# Patient Record
Sex: Male | Born: 1935 | Race: White | Hispanic: No | Marital: Married | Smoking: Former smoker
Health system: Southern US, Community
[De-identification: ages and names within clinical notes are randomized; demographics above are authoritative.]

## PROBLEM LIST (undated history)

## (undated) DIAGNOSIS — F0390 Unspecified dementia without behavioral disturbance: Secondary | ICD-10-CM

## (undated) DIAGNOSIS — M1990 Unspecified osteoarthritis, unspecified site: Secondary | ICD-10-CM

## (undated) DIAGNOSIS — I10 Essential (primary) hypertension: Secondary | ICD-10-CM

## (undated) DIAGNOSIS — I639 Cerebral infarction, unspecified: Secondary | ICD-10-CM

## (undated) HISTORY — DX: Unspecified osteoarthritis, unspecified site: M19.90

## (undated) HISTORY — DX: Cerebral infarction, unspecified: I63.9

## (undated) HISTORY — DX: Essential (primary) hypertension: I10

---

## 2004-02-21 ENCOUNTER — Emergency Department: Payer: Self-pay | Admitting: Emergency Medicine

## 2004-04-30 ENCOUNTER — Other Ambulatory Visit: Payer: Self-pay

## 2004-04-30 ENCOUNTER — Ambulatory Visit: Payer: Self-pay | Admitting: Urology

## 2004-05-05 ENCOUNTER — Ambulatory Visit: Payer: Self-pay | Admitting: Urology

## 2007-08-05 ENCOUNTER — Ambulatory Visit: Payer: Self-pay | Admitting: Dermatology

## 2008-05-09 ENCOUNTER — Ambulatory Visit: Payer: Self-pay | Admitting: Unknown Physician Specialty

## 2008-10-10 ENCOUNTER — Ambulatory Visit: Payer: Self-pay | Admitting: Family Medicine

## 2008-11-26 ENCOUNTER — Ambulatory Visit: Payer: Self-pay | Admitting: Family Medicine

## 2009-03-26 ENCOUNTER — Ambulatory Visit: Payer: Self-pay | Admitting: Family Medicine

## 2009-08-29 ENCOUNTER — Emergency Department: Payer: Self-pay | Admitting: Emergency Medicine

## 2011-01-08 ENCOUNTER — Inpatient Hospital Stay: Payer: Self-pay | Admitting: *Deleted

## 2011-01-09 DIAGNOSIS — I6789 Other cerebrovascular disease: Secondary | ICD-10-CM

## 2011-03-03 ENCOUNTER — Encounter: Payer: Self-pay | Admitting: Family Medicine

## 2013-04-24 ENCOUNTER — Ambulatory Visit: Payer: Self-pay | Admitting: Family Medicine

## 2013-10-09 ENCOUNTER — Ambulatory Visit: Payer: Self-pay | Admitting: Podiatry

## 2014-09-30 ENCOUNTER — Other Ambulatory Visit: Payer: Self-pay | Admitting: Family Medicine

## 2014-11-24 ENCOUNTER — Other Ambulatory Visit: Payer: Self-pay | Admitting: Family Medicine

## 2014-12-12 ENCOUNTER — Ambulatory Visit: Payer: Self-pay | Admitting: Family Medicine

## 2015-04-15 ENCOUNTER — Ambulatory Visit: Payer: PPO | Admitting: Podiatry

## 2015-04-23 ENCOUNTER — Telehealth: Payer: Self-pay | Admitting: Family Medicine

## 2015-04-23 DIAGNOSIS — G309 Alzheimer's disease, unspecified: Principal | ICD-10-CM

## 2015-04-23 DIAGNOSIS — F0280 Dementia in other diseases classified elsewhere without behavioral disturbance: Secondary | ICD-10-CM

## 2015-04-23 NOTE — Telephone Encounter (Signed)
Hospice referral as a clinical decision. I can offer home health referral. We'll need to see patient before I can even begin to consider a hospice referral. Actually I will need to see patient before I can offer home health referral. Either is a face-to-face visit.

## 2015-04-23 NOTE — Telephone Encounter (Signed)
Pt's son is requesting a letter for Hospice to come in the home to help his mother with bathing and other things like that.  He states his father is not able to get around and it is hard for his mother to help him clean himself.  States he is afraid his mother will hurt herself helping him.  Please contact the patient once it is ready for pick up.

## 2015-04-24 NOTE — Telephone Encounter (Signed)
Patient's son Lorin Picket advised of face-to-face visit before referral can be made. Lorin Picket reports that patient is very demented and is very weak and falls often. Lorin Picket reports that he has a very hard time getting pt up and does not know how he will be able to get him in the office. Please advise.

## 2015-04-26 ENCOUNTER — Other Ambulatory Visit: Payer: Self-pay | Admitting: Emergency Medicine

## 2015-04-26 DIAGNOSIS — R296 Repeated falls: Secondary | ICD-10-CM

## 2015-04-26 DIAGNOSIS — F0280 Dementia in other diseases classified elsewhere without behavioral disturbance: Secondary | ICD-10-CM

## 2015-04-26 DIAGNOSIS — G309 Alzheimer's disease, unspecified: Principal | ICD-10-CM

## 2015-04-26 NOTE — Telephone Encounter (Signed)
I have talked with Dr. Sullivan Lone and sent in orders for home health and PT for this patient. (he did a house call last night) Pt son has a meeting with [facility] today to see how they like them because they have a male nurse available and would be best for the pt.   Pt sonLorin [phone]

## 2015-04-29 NOTE — Addendum Note (Signed)
Addended by: Julieanne Manson on: 04/29/2015 09:27 AM   Modules accepted: Orders

## 2015-04-29 NOTE — Telephone Encounter (Signed)
I made a house call to see the patient and his wife as the wife and son said he was unable to get into the office. He has moderate dementia and is now having frequent falls due to balance issues and his unwillingness to use a walker. His appetite is good and he is not losing weight. He has no complaints when I see him in the home. Exam reveals an elderly white male in no acute distress. He is alert to person and place and knows who I am. Otherwise he obviously has a lot of confusion. He is unwilling to cooperate with his wife regarding the use of the walker. Exam reveals an to be atraumatic normocephalic. CO are regular rate and rhythm lungs clear abdomen is soft. He moves all 4 extremities without any obvious discomfort. He has trouble getting out of a low sitting couch and is unsteady on his feet even when using a walker. Long-term outlook for this 82 year old gentleman is unknown but they're certainly nowhere can call in hospice and say that he has less than a year to live. One of these falls may cut short his life but at this time we will obtain home health consultation with home  physical therapy. His wife lives with him but she has great difficulty getting him to the car by herself. Hopefully home physical therapy will be able to lower the risk of falls but I think his dementia and unwillingness  to cooperate will get in the way.

## 2015-05-01 ENCOUNTER — Telehealth: Payer: Self-pay | Admitting: Family Medicine

## 2015-05-01 NOTE — Telephone Encounter (Signed)
Larry Davidson with [facility] called to advise pt will be seen tomorrow for home health.  CB#[phone]/MW

## 2015-05-01 NOTE — Telephone Encounter (Signed)
ok 

## 2015-05-01 NOTE — Telephone Encounter (Signed)
[id number]-aa 

## 2015-05-02 DIAGNOSIS — Z9181 History of falling: Secondary | ICD-10-CM | POA: Diagnosis not present

## 2015-05-02 DIAGNOSIS — R2689 Other abnormalities of gait and mobility: Secondary | ICD-10-CM | POA: Diagnosis not present

## 2015-05-02 DIAGNOSIS — M6281 Muscle weakness (generalized): Secondary | ICD-10-CM | POA: Diagnosis not present

## 2015-05-02 DIAGNOSIS — F0280 Dementia in other diseases classified elsewhere without behavioral disturbance: Secondary | ICD-10-CM | POA: Diagnosis not present

## 2015-05-02 DIAGNOSIS — G309 Alzheimer's disease, unspecified: Secondary | ICD-10-CM | POA: Diagnosis not present

## 2015-05-02 DIAGNOSIS — I10 Essential (primary) hypertension: Secondary | ICD-10-CM | POA: Diagnosis not present

## 2015-05-02 DIAGNOSIS — Z7901 Long term (current) use of anticoagulants: Secondary | ICD-10-CM | POA: Diagnosis not present

## 2015-05-03 ENCOUNTER — Telehealth: Payer: Self-pay | Admitting: Family Medicine

## 2015-05-03 NOTE — Telephone Encounter (Signed)
Spoke with Nash Dimmer, she needed additional diagnoses for pt. Referred to allscripts for this.

## 2015-05-03 NOTE — Telephone Encounter (Signed)
Larry Davidson (with [facility]) calling to see if there are any other orders in for this pt, Larry Davidson states all she can see in Epic is orders for Alzheimer's & Falls. Larry Davidson states that she would like a return call. CB# [phone]   Thanks CC

## 2015-05-06 ENCOUNTER — Ambulatory Visit (INDEPENDENT_AMBULATORY_CARE_PROVIDER_SITE_OTHER): Payer: PPO | Admitting: Podiatry

## 2015-05-06 ENCOUNTER — Encounter: Payer: Self-pay | Admitting: Podiatry

## 2015-05-06 VITALS — BP 131/88 | HR 86 | Resp 12

## 2015-05-06 DIAGNOSIS — M79676 Pain in unspecified toe(s): Secondary | ICD-10-CM | POA: Diagnosis not present

## 2015-05-06 DIAGNOSIS — B351 Tinea unguium: Secondary | ICD-10-CM

## 2015-05-06 NOTE — Progress Notes (Signed)
   Subjective:    Patient ID: Larry Davidson, male    DOB: 25-Aug-1923, 82 y.o.   MRN: [id number]  HPI: He presents today with his wife chief complaint of painful elongated toenails. His wife states that she can no longer cut his nails for him and they're starting to grow into his skin.    Review of Systems  Constitutional: Positive for activity change, appetite change and unexpected weight change.  Musculoskeletal: Positive for myalgias and back pain.  Neurological: Positive for weakness.  Hematological: Bruises/bleeds easily.       Objective:   Physical Exam: Vital signs are stable he is alert. He had does have some dementia so conversations are brief. Pulses are palpable bilateral. Flexible hammertoe deformities are noted. Neurologic sensorium is noted deep tendon reflexes are intact muscle strength is normal bilateral. Cutaneous evaluation straight supple well-hydrated cutis toenails are thick yellow dystrophic likely clinically mycotic sharply incurvated and painful on debridement.        Assessment & Plan:  Pain in limb secondary to onychomycosis.  Plan: Debridement of toenails 1 through 5 bilateral.

## 2015-05-10 DIAGNOSIS — F0280 Dementia in other diseases classified elsewhere without behavioral disturbance: Secondary | ICD-10-CM | POA: Diagnosis not present

## 2015-05-10 DIAGNOSIS — Z7901 Long term (current) use of anticoagulants: Secondary | ICD-10-CM | POA: Diagnosis not present

## 2015-05-10 DIAGNOSIS — R2689 Other abnormalities of gait and mobility: Secondary | ICD-10-CM | POA: Diagnosis not present

## 2015-05-10 DIAGNOSIS — I10 Essential (primary) hypertension: Secondary | ICD-10-CM | POA: Diagnosis not present

## 2015-05-10 DIAGNOSIS — M6281 Muscle weakness (generalized): Secondary | ICD-10-CM | POA: Diagnosis not present

## 2015-05-10 DIAGNOSIS — Z9181 History of falling: Secondary | ICD-10-CM | POA: Diagnosis not present

## 2015-05-10 DIAGNOSIS — G309 Alzheimer's disease, unspecified: Secondary | ICD-10-CM | POA: Diagnosis not present

## 2015-05-17 DIAGNOSIS — R2689 Other abnormalities of gait and mobility: Secondary | ICD-10-CM | POA: Diagnosis not present

## 2015-05-17 DIAGNOSIS — G309 Alzheimer's disease, unspecified: Secondary | ICD-10-CM | POA: Diagnosis not present

## 2015-05-17 DIAGNOSIS — F0280 Dementia in other diseases classified elsewhere without behavioral disturbance: Secondary | ICD-10-CM | POA: Diagnosis not present

## 2015-05-17 DIAGNOSIS — I10 Essential (primary) hypertension: Secondary | ICD-10-CM | POA: Diagnosis not present

## 2015-05-31 DIAGNOSIS — G309 Alzheimer's disease, unspecified: Secondary | ICD-10-CM | POA: Diagnosis not present

## 2015-05-31 DIAGNOSIS — R2689 Other abnormalities of gait and mobility: Secondary | ICD-10-CM | POA: Diagnosis not present

## 2015-05-31 DIAGNOSIS — Z9181 History of falling: Secondary | ICD-10-CM | POA: Diagnosis not present

## 2015-05-31 DIAGNOSIS — F0280 Dementia in other diseases classified elsewhere without behavioral disturbance: Secondary | ICD-10-CM | POA: Diagnosis not present

## 2015-05-31 DIAGNOSIS — Z7901 Long term (current) use of anticoagulants: Secondary | ICD-10-CM | POA: Diagnosis not present

## 2015-05-31 DIAGNOSIS — I10 Essential (primary) hypertension: Secondary | ICD-10-CM | POA: Diagnosis not present

## 2015-05-31 DIAGNOSIS — M6281 Muscle weakness (generalized): Secondary | ICD-10-CM | POA: Diagnosis not present

## 2015-07-23 ENCOUNTER — Other Ambulatory Visit: Payer: Self-pay | Admitting: Family Medicine

## 2015-07-30 ENCOUNTER — Other Ambulatory Visit: Payer: Self-pay | Admitting: Family Medicine

## 2015-08-05 ENCOUNTER — Encounter: Payer: Self-pay | Admitting: Podiatry

## 2015-08-05 ENCOUNTER — Ambulatory Visit (INDEPENDENT_AMBULATORY_CARE_PROVIDER_SITE_OTHER): Payer: PPO | Admitting: Podiatry

## 2015-08-05 DIAGNOSIS — B351 Tinea unguium: Secondary | ICD-10-CM | POA: Diagnosis not present

## 2015-08-05 DIAGNOSIS — M79676 Pain in unspecified toe(s): Secondary | ICD-10-CM | POA: Diagnosis not present

## 2015-08-07 NOTE — Progress Notes (Signed)
Presents today with a chief complaint of painful elongated toenails.  Objective: Vital signs are stable alert and oriented 3. Pulses are palpable. Neurologic sensorium is intact. Deep tendon reflexes are intact. Toenails are thick yellow dystrophic onychomycotic.  Assessment: Pain in limb secondary to onychomycosis 1 through 5 bilateral.  Plan: Debridement of toenails 1 through 5 bilateral.

## 2015-10-07 ENCOUNTER — Other Ambulatory Visit: Payer: Self-pay

## 2015-10-07 MED ORDER — ATENOLOL 25 MG PO TABS: 25.0000 mg | ORAL_TABLET | Freq: Every day | ORAL | Status: DC

## 2015-11-03 ENCOUNTER — Other Ambulatory Visit: Payer: Self-pay | Admitting: Family Medicine

## 2015-11-06 ENCOUNTER — Ambulatory Visit (INDEPENDENT_AMBULATORY_CARE_PROVIDER_SITE_OTHER): Payer: PPO | Admitting: Family Medicine

## 2015-11-06 DIAGNOSIS — F0390 Unspecified dementia without behavioral disturbance: Secondary | ICD-10-CM | POA: Diagnosis not present

## 2015-11-06 DIAGNOSIS — I10 Essential (primary) hypertension: Secondary | ICD-10-CM | POA: Diagnosis not present

## 2015-11-06 DIAGNOSIS — E785 Hyperlipidemia, unspecified: Secondary | ICD-10-CM | POA: Diagnosis not present

## 2015-11-06 MED ORDER — AMLODIPINE BESYLATE 5 MG PO TABS: 5.0000 mg | ORAL_TABLET | Freq: Every day | ORAL | 3 refills | Status: DC

## 2015-11-06 NOTE — Progress Notes (Signed)
Subjective:  HPI  Hypertension, follow-up:  BP Readings from Last 3 Encounters:  11/06/15 (!) 124/56  05/06/15 131/88    He was last seen for hypertension 1 years ago.  BP at that visit was 131/88. Management since that visit includes none. He reports good compliance with treatment. He is not having side effects.  He is not exercising. He is not adherent to low salt diet.   Outside blood pressures are not being checked. He is experiencing none.  Patient denies chest pain, chest pressure/discomfort, claudication, dyspnea, exertional chest pressure/discomfort, irregular heart beat, lower extremity edema, near-syncope, orthopnea, paroxysmal nocturnal dyspnea and syncope.   Cardiovascular risk factors include advanced age (older than 2055 for men, 6865 for women), dyslipidemia, hypertension and male gender.  Wt Readings from Last 3 Encounters:  11/06/15 134 lb (60.8 kg)    ------------------------------------------------------------------------   Lipid/Cholesterol, Follow-up:   Last seen for this1 years ago.  Management changes since that visit include none. . Last Lipid Panel: No results found for: CHOL, TRIG, HDL, CHOLHDL, VLDL, LDLCALC, LDLDIRECT  Risk factors for vascular disease include hypercholesterolemia and hypertension  He reports good compliance with treatment. He is not having side effects.  Current symptoms include none and have been unchanged. Weight trend: decreasing  Wt Readings from Last 3 Encounters:  11/06/15 134 lb (60.8 kg)    ------------------------------------------------------------------- Dementia- pt wife reports that he has been sleeping all day and not eating. He says he is not hungry.   Prior to Admission medications   Medication Sig Start Date End Date Taking? Authorizing Provider  amLODipine (NORVASC) 5 MG tablet TAKE 1 TABLET BY MOUTH AT BEDTIME 07/23/15  Yes Annisten Manchester Hulen ShoutsL  Jr., MD  atenolol (TENORMIN) 25 MG tablet Take 1 tablet  (25 mg total) by mouth daily. 10/07/15   Taegen Lennox Hulen ShoutsL  Jr., MD  clopidogrel (PLAVIX) 75 MG tablet 1 TABLET, ORAL, DAILY 11/26/14   Maple Hudsonichard L  Jr., MD  donepezil (ARICEPT) 10 MG tablet 1 TABLET TABLET, ORAL, DAILY 11/26/14   Maple Hudsonichard L  Jr., MD  mirtazapine (REMERON) 30 MG tablet TAKE 1 TABLET BY MOUTH AT BEDTIME 07/30/15   Maple Hudsonichard L  Jr., MD  pravastatin (PRAVACHOL) 40 MG tablet TAKE 1 TABLET BY MOUTH AT BEDTIME 07/23/15   Jasaiah Karwowski Hulen ShoutsL  Jr., MD    Patient Active Problem List   Diagnosis Date Noted  . Hypertension 11/06/2015  . Dementia 11/06/2015  . Hyperlipidemia 11/06/2015    No past medical history on file.  Social History   Social History  . Marital status: Married    Spouse name: N/A  . Number of children: N/A  . Years of education: N/A   Occupational History  . Not on file.   Social History Main Topics  . Smoking status: Never Smoker  . Smokeless tobacco: Not on file  . Alcohol use No  . Drug use: No  . Sexual activity: Not on file   Other Topics Concern  . Not on file   Social History Narrative  . No narrative on file    No Known Allergies  Review of Systems  Constitutional: Negative.   HENT: Negative.   Eyes: Negative.   Respiratory: Negative.   Cardiovascular: Negative.   Gastrointestinal: Negative.   Genitourinary: Negative.   Musculoskeletal: Negative.   Skin: Negative.   Endo/Heme/Allergies: Negative.   Psychiatric/Behavioral: Positive for memory loss.     There is no immunization history on file for this patient. Objective:  BP (!) 124/56 (  BP Location: Left Arm, Patient Position: Sitting, Cuff Size: Normal)   Pulse 64   Temp 97.5 F (36.4 C) (Oral)   Resp 16   Wt 134 lb (60.8 kg)   Physical Exam  Constitutional: He is oriented to person, place, and time and well-developed, well-nourished, and in no distress.  HENT:  Head: Normocephalic and atraumatic.  Eyes: Conjunctivae and EOM are normal. Pupils are equal,  round, and reactive to light.  Neck: Normal range of motion. Neck supple.  Cardiovascular: Normal rate, regular rhythm, normal heart sounds and intact distal pulses.   Pulmonary/Chest: Effort normal and breath sounds normal.  Abdominal: Soft.  Musculoskeletal: Normal range of motion.  Neurological: He is alert and oriented to person, place, and time. He has normal reflexes. Gait normal. GCS score is 15.  Skin: Skin is warm and dry.  Psychiatric: Mood, memory, affect and judgment normal.    No results found for: WBC, HGB, HCT, PLT, GLUCOSE, CHOL, TRIG, HDL, LDLDIRECT, LDLCALC, TSH, PSA, INR, GLUF, HGBA1C, MICROALBUR  CMP  No results found for: NA, K, CL, CO2, GLUCOSE, BUN, CREATININE, CALCIUM, PROT, ALBUMIN, AST, ALT, ALKPHOS, BILITOT, GFRNONAA, GFRAA  Assessment and Plan :  1. Essential hypertension Stop Atenolol due to weight loss. May stop Plavix and statin in the future.   2. Dementia, without behavioral disturbance/mpderate/progressive MMSE today 14/30  3. Hyperlipidemia Consider stopping statin in the foreseeable future   Patient was seen and examined by Dr. Julieanne Manson, and noted scribed by Dimas Chyle, CMA Julieanne Manson MD  [facility] 11/06/2015 3:04 PM

## 2015-11-06 NOTE — Patient Instructions (Signed)
Stop Atenolol

## 2015-11-07 DIAGNOSIS — E785 Hyperlipidemia, unspecified: Secondary | ICD-10-CM | POA: Diagnosis not present

## 2015-11-07 DIAGNOSIS — I10 Essential (primary) hypertension: Secondary | ICD-10-CM | POA: Diagnosis not present

## 2015-11-08 LAB — CBC WITH DIFFERENTIAL/PLATELET
BASOS: 1 %
Basophils Absolute: 0 10*3/uL (ref 0.0–0.2)
EOS (ABSOLUTE): 0.2 10*3/uL (ref 0.0–0.4)
EOS: 3 %
HEMATOCRIT: 42.3 % (ref 37.5–51.0)
HEMOGLOBIN: 14.3 g/dL (ref 12.6–17.7)
Immature Grans (Abs): 0 10*3/uL (ref 0.0–0.1)
Immature Granulocytes: 0 %
LYMPHS ABS: 2.5 10*3/uL (ref 0.7–3.1)
Lymphs: 36 %
MCH: 31.9 pg (ref 26.6–33.0)
MCHC: 33.8 g/dL (ref 31.5–35.7)
MCV: 94 fL (ref 79–97)
MONOCYTES: 8 %
Monocytes Absolute: 0.6 10*3/uL (ref 0.1–0.9)
NEUTROS ABS: 3.6 10*3/uL (ref 1.4–7.0)
NEUTROS PCT: 52 %
Platelets: 188 10*3/uL (ref 150–379)
RBC: 4.48 x10E6/uL (ref 4.14–5.80)
RDW: 14.5 % (ref 12.3–15.4)
WBC: 6.9 10*3/uL (ref 3.4–10.8)

## 2015-11-08 LAB — TSH: TSH: 0.694 u[IU]/mL (ref 0.450–4.500)

## 2015-11-08 LAB — COMPREHENSIVE METABOLIC PANEL
ALBUMIN: 3.9 g/dL (ref 3.5–4.7)
ALK PHOS: 66 IU/L (ref 39–117)
ALT: 10 IU/L (ref 0–44)
AST: 10 IU/L (ref 0–40)
Albumin/Globulin Ratio: 1.7 (ref 1.2–2.2)
BILIRUBIN TOTAL: 0.5 mg/dL (ref 0.0–1.2)
BUN / CREAT RATIO: 16 (ref 10–24)
BUN: 21 mg/dL (ref 8–27)
CO2: 24 mmol/L (ref 18–29)
CREATININE: 1.35 mg/dL — AB (ref 0.76–1.27)
Calcium: 10.6 mg/dL — ABNORMAL HIGH (ref 8.6–10.2)
Chloride: 105 mmol/L (ref 96–106)
GFR calc non Af Amer: 46 mL/min/{1.73_m2} — ABNORMAL LOW (ref >59)
GFR, EST AFRICAN AMERICAN: 53 mL/min/{1.73_m2} — AB (ref >59)
GLOBULIN, TOTAL: 2.3 g/dL (ref 1.5–4.5)
Glucose: 99 mg/dL (ref 65–99)
Potassium: 4.5 mmol/L (ref 3.5–5.2)
SODIUM: 146 mmol/L — AB (ref 134–144)
Total Protein: 6.2 g/dL (ref 6.0–8.5)

## 2015-11-08 LAB — LIPID PANEL WITH LDL/HDL RATIO
Cholesterol, Total: 131 mg/dL (ref 100–199)
HDL: 39 mg/dL — AB (ref >39)
LDL Calculated: 70 mg/dL (ref 0–99)
LDL/HDL RATIO: 1.8 ratio (ref 0.0–3.6)
Triglycerides: 109 mg/dL (ref 0–149)
VLDL Cholesterol Cal: 22 mg/dL (ref 5–40)

## 2015-11-09 ENCOUNTER — Other Ambulatory Visit: Payer: Self-pay | Admitting: Family Medicine

## 2015-11-11 ENCOUNTER — Ambulatory Visit: Payer: PPO | Admitting: Podiatry

## 2015-11-11 ENCOUNTER — Other Ambulatory Visit: Payer: Self-pay

## 2015-11-11 NOTE — Telephone Encounter (Signed)
Pt called needing refills on Pravastatin and Mirtazapine. Allene DillonEmily Drozdowski, CMA

## 2015-11-12 MED ORDER — PRAVASTATIN SODIUM 40 MG PO TABS: 40.0000 mg | ORAL_TABLET | Freq: Every day | ORAL | 1 refills | Status: DC

## 2015-11-12 MED ORDER — MIRTAZAPINE 30 MG PO TABS: 30.0000 mg | ORAL_TABLET | Freq: Every day | ORAL | 1 refills | Status: DC

## 2015-12-01 ENCOUNTER — Other Ambulatory Visit: Payer: Self-pay | Admitting: Family Medicine

## 2015-12-05 ENCOUNTER — Other Ambulatory Visit: Payer: Self-pay | Admitting: Family Medicine

## 2015-12-09 ENCOUNTER — Other Ambulatory Visit: Payer: Self-pay | Admitting: Family Medicine

## 2015-12-23 ENCOUNTER — Telehealth: Payer: Self-pay | Admitting: Family Medicine

## 2015-12-23 NOTE — Telephone Encounter (Signed)
Pt's wife called wanting to know if he is due for his pneumonia vaccine.  Please advise [phone]  Thank sTeri

## 2015-12-23 NOTE — Telephone Encounter (Signed)
[facility], we do not have any pneumonia vaccines on file. I know that the patient would be due, but which one does he have 1st? Please advise. Thanks!

## 2015-12-23 NOTE — Telephone Encounter (Signed)
He had Prevnar in 2015 per [facility] records so he needs Pneumovax now since there is no record of it-aa

## 2015-12-23 NOTE — Telephone Encounter (Signed)
Advised wife as below. She reports that patient will get this through the pharmacy.

## 2016-02-12 ENCOUNTER — Encounter: Payer: Self-pay | Admitting: Family Medicine

## 2016-02-12 ENCOUNTER — Ambulatory Visit (INDEPENDENT_AMBULATORY_CARE_PROVIDER_SITE_OTHER): Payer: PPO | Admitting: Family Medicine

## 2016-02-12 ENCOUNTER — Ambulatory Visit: Payer: PPO | Admitting: Family Medicine

## 2016-02-12 VITALS — BP 132/64 | HR 88 | Temp 97.4°F | Resp 16 | Wt 134.0 lb

## 2016-02-12 DIAGNOSIS — E784 Other hyperlipidemia: Secondary | ICD-10-CM

## 2016-02-12 DIAGNOSIS — F0280 Dementia in other diseases classified elsewhere without behavioral disturbance: Secondary | ICD-10-CM | POA: Diagnosis not present

## 2016-02-12 DIAGNOSIS — I10 Essential (primary) hypertension: Secondary | ICD-10-CM

## 2016-02-12 DIAGNOSIS — E7849 Other hyperlipidemia: Secondary | ICD-10-CM

## 2016-02-12 DIAGNOSIS — G309 Alzheimer's disease, unspecified: Secondary | ICD-10-CM | POA: Diagnosis not present

## 2016-02-12 NOTE — Progress Notes (Addendum)
Larry Davidson  MRN: [id number] DOB: [birth year]  Subjective:  HPI  Patient is here for 3 months follow up.  Patient weighs the same 134lb today. Last time Atenolol was stopped due to weight loss. Patient states he eats fine but wife states patient will stay in bed at night and all day and she has to tell him to get up and eat. At the most twice a day. Husband states that he just feels lazy and does not want to get up. He is not checking his b/p.  MMSE on last visit in August was 14/30. He is sleeping all night. He still lives at home with his wife and he is not physically or verbally abusive even when he is confused.  Wt Readings from Last 3 Encounters:  02/12/16 134 lb (60.8 kg)  11/06/15 134 lb (60.8 kg)   Patient Active Problem List   Diagnosis Date Noted  . Hypertension 11/06/2015  . Dementia 11/06/2015  . Hyperlipidemia 11/06/2015    No past medical history on file.  Social History   Social History  . Marital status: Married    Spouse name: N/A  . Number of children: N/A  . Years of education: N/A   Occupational History  . Not on file.   Social History Main Topics  . Smoking status: Never Smoker  . Smokeless tobacco: Not on file  . Alcohol use No  . Drug use: No  . Sexual activity: Not on file   Other Topics Concern  . Not on file   Social History Narrative  . No narrative on file    Outpatient Encounter Prescriptions as of 02/12/2016  Medication Sig Note  . amLODipine (NORVASC) 5 MG tablet Take 1 tablet (5 mg total) by mouth at bedtime.   . clopidogrel (PLAVIX) 75 MG tablet TAKE 1 TABLET DAILY   . donepezil (ARICEPT) 10 MG tablet TAKE 1 TABLET DAILY   . mirtazapine (REMERON) 30 MG tablet Take 1 tablet (30 mg total) by mouth at bedtime.   . pravastatin (PRAVACHOL) 40 MG tablet Take 1 tablet (40 mg total) by mouth at bedtime.   . [DISCONTINUED] FLUZONE HIGH-DOSE 0.5 ML SUSY  02/12/2016: Received from: External Pharmacy  . [DISCONTINUED] PNEUMOVAX  23 25 MCG/0.5ML injection  02/12/2016: Received from: External Pharmacy   No facility-administered encounter medications on file as of 02/12/2016.     No Known Allergies  Review of Systems  Constitutional: Negative.        Not eating a lot. "feels lazy"  Respiratory: Negative.   Cardiovascular: Negative.   Musculoskeletal: Negative.   Neurological: Negative for dizziness and tingling.  Psychiatric/Behavioral: Positive for memory loss.    Objective:  BP 132/64   Pulse 88   Temp 97.4 F (36.3 C)   Resp 16   Wt 134 lb (60.8 kg)   Physical Exam  Constitutional: He is well-developed, well-nourished, and in no distress.  Cardiovascular: Normal rate, regular rhythm, normal heart sounds and intact distal pulses.   Pulmonary/Chest: Effort normal and breath sounds normal. No respiratory distress. He has no wheezes.    Assessment and Plan :  1. Essential hypertension Stable.  2. Other hyperlipidemia Discussed possible side effects with this medication. Advised wife to finish Pravastatin and then stop. I think benefits minimal at this time. 3. Alzheimer's dementia without behavioral disturbance, unspecified timing of dementia onset Weight  Is stable.Will follow and pt will worsen or  More than 50% of visit is spent in counselling .  4. Hypercalcemia Re check levels today, I do not think we need to get further work up for this at his age.May discuss with endocrine if this is higher or if w/u is positive in any way. - PTH, Intact and Calcium - Calcium, ionized  HPI, Exam and A&P transcribed under direction and in the presence of Julieanne Mansonichard , MD. I have done the exam and reviewed the chart and it is accurate to the best of my knowledge. [employer]agon  technology has been used and  any errors in dictation or transcription are unintentional. Julieanne Mansonichard  M.D. [facility]  [facility]will [facility] calcium in  December--if above 11 consider referring to Endocrine or treating with Cinacalcet 30mg  BID.

## 2016-02-13 LAB — CALCIUM, IONIZED: CALCIUM ION: 5.9 mg/dL — AB (ref 4.5–5.6)

## 2016-02-13 LAB — PTH, INTACT AND CALCIUM
Calcium: 10.9 mg/dL — ABNORMAL HIGH (ref 8.6–10.2)
PTH: 77 pg/mL — ABNORMAL HIGH (ref 15–65)

## 2016-03-03 ENCOUNTER — Ambulatory Visit: Payer: Self-pay | Admitting: Family Medicine

## 2016-03-03 ENCOUNTER — Ambulatory Visit (INDEPENDENT_AMBULATORY_CARE_PROVIDER_SITE_OTHER): Payer: PPO | Admitting: Family Medicine

## 2016-03-03 ENCOUNTER — Telehealth: Payer: Self-pay | Admitting: Family Medicine

## 2016-03-03 ENCOUNTER — Encounter: Payer: Self-pay | Admitting: Family Medicine

## 2016-03-03 VITALS — BP 116/58 | HR 105 | Temp 98.1°F | Resp 16

## 2016-03-03 DIAGNOSIS — I10 Essential (primary) hypertension: Secondary | ICD-10-CM

## 2016-03-03 NOTE — Progress Notes (Signed)
Patient: Larry Davidson Male    DOB: 1923/10/07   82 y.o.   MRN: [id number] Visit Date: 03/03/2016  Today's Provider: Megan Mansichard Gilbert Jr, MD   Chief Complaint  Patient presents with  . Weakness   Subjective:    HPI   Hypercalcemia Pt had labs checked on 02/12/2016, and was found to have high calcium (10.9, PTH was 77). Pt's wife is concerned because pt has been experiencing generalized weakness, which has been getting worse this week. Pt also experiencing recurrent falls (2 times this week), appetite loss, possible weight loss, fatigue, some disorientation. Wife had trouble getting pt to appt today due to pt wishing to sleep and unsteady gait. [place] helps wife bring pt in today.  Denies dizziness, presyncope, syncope, headache.  No Known Allergies   Current Outpatient Prescriptions:  .  amLODipine (NORVASC) 5 MG tablet, Take 1 tablet (5 mg total) by mouth at bedtime., Disp: 90 tablet, Rfl: 3 .  clopidogrel (PLAVIX) 75 MG tablet, TAKE 1 TABLET DAILY, Disp: 90 tablet, Rfl: 3 .  donepezil (ARICEPT) 10 MG tablet, TAKE 1 TABLET DAILY, Disp: 90 tablet, Rfl: 3 .  mirtazapine (REMERON) 30 MG tablet, Take 1 tablet (30 mg total) by mouth at bedtime., Disp: 90 tablet, Rfl: 1 .  pravastatin (PRAVACHOL) 40 MG tablet, Take 40 mg by mouth daily. , Disp: , Rfl:   Review of Systems  Constitutional: Positive for activity change, appetite change, fatigue and unexpected weight change (losing per pt; did not weigh today due to weakness and being in wheelchair). Negative for chills, diaphoresis and fever.  HENT: Negative.   Respiratory: Negative for shortness of breath.   Cardiovascular: Negative for chest pain, palpitations and leg swelling.  Endocrine: Positive for cold intolerance.  Allergic/Immunologic: Negative.   Neurological: Positive for weakness. Negative for dizziness, syncope and headaches.  Psychiatric/Behavioral: Negative.     Social History  Substance Use Topics  .  Smoking status: Never Smoker  . Smokeless tobacco: Not on file  . Alcohol use No   Objective:   BP (!) 116/58 (BP Location: Right Arm, Patient Position: Sitting, Cuff Size: Normal)   Pulse (!) 105   Temp 98.1 F (36.7 C) (Oral)   Resp 16   SpO2 97%   Physical Exam  Constitutional: He is oriented to person, place, and time. He appears well-developed and well-nourished.  HENT:  Head: Normocephalic and atraumatic.  Right Ear: External ear normal.  Left Ear: External ear normal.  Nose: Nose normal.  Eyes: Conjunctivae are normal. No scleral icterus.  Neck: No thyromegaly present.  Cardiovascular: Normal rate, regular rhythm and normal heart sounds.   Pulmonary/Chest: Effort normal and breath sounds normal.  Abdominal: Soft.  Neurological: He is alert and oriented to person, place, and time. No cranial nerve deficit. He exhibits normal muscle tone. Coordination normal.  Skin: Skin is warm and dry.  Psychiatric: He has a normal mood and affect. His behavior is normal. Judgment and thought content normal.        Assessment & Plan:     1. Essential hypertension  - Comprehensive metabolic panel - TSH  2. Hypercalcemia Difficulty here is advanced age and progressive moderate Alzheimers Disease.Refer to Endocrinology for input on moving forward. Hypercalcemia could be making cognitive issues worse. - CBC with Differential/Platelet - CK - Ambulatory referral to Endocrinology 3.Alzheimers Dementia--moderate Pt on Aricept--consider 2nd drug. Assisted living may become necessary--this was discussed with wife briefly.  4.Fatigue Multifactorial--pt sleeping 10-18  hours per day c/w dementia progression.  5.Insomnia Treated with Mirtazapine which helped initially. 6.Fall Risk Major issue going forward.  I have done the exam and reviewed the chart and it is accurate to the best of my knowledge. DentistDragon  technology has been used and  any errors in dictation or transcription are  unintentional. Julieanne Mansonichard Gilbert M.D. [facility]      Patient seen and examined by Julieanne Mansonichard Gilbert, MD, and note scribed by Allene DillonEmily Drozdowski, CMA. I have done the exam and reviewed the above chart and it is accurate to the best of my knowledge. DentistDragon  technology has been used in this note in any air is in the dictation or transcription are unintentional.  Megan Mansichard Gilbert Jr, MD  [facility]

## 2016-03-03 NOTE — Telephone Encounter (Signed)
spoke with wife. Pt was up and moving fine this morning, then wast sitting on the couch and and is disoriented and weak. When he tries to get up he stumbles around. She report that he breathing different. She was advised to call EMS and go to the ER, but she said she was going to call her son and figure out what she needed to so. Spoke with Dr. Sullivan LoneGilbert through out the conversation with her and he agreed she needed to call EMS for him and told me he would even call over there to let them know she was coming. She still said she would call her son and decide.

## 2016-03-03 NOTE — Telephone Encounter (Signed)
Pt's wife called stated that pt was too weak to make the appt this afternoon and wanted to get a home health nurse to the home instead b/c she thinks it might be pt's calcium level that is causing pt to be so fatigued and not eating this week. I tried to explain to pt we needed an OV for home health. She requested to keep the appt for this afternoon for now and requested a nurse call her back. Thanks TNP

## 2016-03-04 LAB — COMPREHENSIVE METABOLIC PANEL
A/G RATIO: 1.8 (ref 1.2–2.2)
ALBUMIN: 4.1 g/dL (ref 3.2–4.6)
ALK PHOS: 53 IU/L (ref 39–117)
ALT: 16 IU/L (ref 0–44)
AST: 14 IU/L (ref 0–40)
BILIRUBIN TOTAL: 0.3 mg/dL (ref 0.0–1.2)
BUN / CREAT RATIO: 32 — AB (ref 10–24)
BUN: 43 mg/dL — ABNORMAL HIGH (ref 10–36)
CHLORIDE: 106 mmol/L (ref 96–106)
CO2: 26 mmol/L (ref 18–29)
Calcium: 10.9 mg/dL — ABNORMAL HIGH (ref 8.6–10.2)
Creatinine, Ser: 1.35 mg/dL — ABNORMAL HIGH (ref 0.76–1.27)
GFR calc Af Amer: 53 mL/min/{1.73_m2} — ABNORMAL LOW (ref >59)
GFR calc non Af Amer: 46 mL/min/{1.73_m2} — ABNORMAL LOW (ref >59)
GLOBULIN, TOTAL: 2.3 g/dL (ref 1.5–4.5)
GLUCOSE: 147 mg/dL — AB (ref 65–99)
POTASSIUM: 4.1 mmol/L (ref 3.5–5.2)
SODIUM: 147 mmol/L — AB (ref 134–144)
Total Protein: 6.4 g/dL (ref 6.0–8.5)

## 2016-03-04 LAB — CBC WITH DIFFERENTIAL/PLATELET
BASOS ABS: 0 10*3/uL (ref 0.0–0.2)
Basos: 0 %
EOS (ABSOLUTE): 0 10*3/uL (ref 0.0–0.4)
Eos: 0 %
HEMATOCRIT: 30.9 % — AB (ref 37.5–51.0)
HEMOGLOBIN: 10.4 g/dL — AB (ref 13.0–17.7)
Immature Grans (Abs): 0 10*3/uL (ref 0.0–0.1)
Immature Granulocytes: 0 %
LYMPHS ABS: 1.4 10*3/uL (ref 0.7–3.1)
Lymphs: 15 %
MCH: 31.9 pg (ref 26.6–33.0)
MCHC: 33.7 g/dL (ref 31.5–35.7)
MCV: 95 fL (ref 79–97)
MONOCYTES: 6 %
Monocytes Absolute: 0.5 10*3/uL (ref 0.1–0.9)
NEUTROS ABS: 7.6 10*3/uL — AB (ref 1.4–7.0)
Neutrophils: 79 %
Platelets: 287 10*3/uL (ref 150–379)
RBC: 3.26 x10E6/uL — ABNORMAL LOW (ref 4.14–5.80)
RDW: 14.4 % (ref 12.3–15.4)
WBC: 9.6 10*3/uL (ref 3.4–10.8)

## 2016-03-04 LAB — CK: Total CK: 34 U/L (ref 24–204)

## 2016-03-04 LAB — TSH: TSH: 0.444 u[IU]/mL — AB (ref 0.450–4.500)

## 2016-03-09 ENCOUNTER — Telehealth: Payer: Self-pay | Admitting: Family Medicine

## 2016-03-09 NOTE — Telephone Encounter (Signed)
OK for now to see endocrinology.

## 2016-03-09 NOTE — Telephone Encounter (Signed)
Just spoke with Rosey Batheresa, wife called again and states they have not heard from endocrinologist yet, looks like information was faxed to their office on Friday. She wants to know does patient really need to come tomorrow he is doing much better and if Dr Sullivan LoneGilbert does not know what to do for patient and that is why he was referred to endocrinology then he does not need to come in for nothing in her opinion. Please review-aa

## 2016-03-09 NOTE — Telephone Encounter (Signed)
Patient's wife already cancelled appointment earlier at 12:25 pm-aa

## 2016-03-09 NOTE — Telephone Encounter (Signed)
Pt wife is asking if a nurse will call back to discuss pt health.  CB#[phone]/MW

## 2016-03-10 ENCOUNTER — Ambulatory Visit: Payer: PPO | Admitting: Family Medicine

## 2016-03-13 DIAGNOSIS — E210 Primary hyperparathyroidism: Secondary | ICD-10-CM | POA: Diagnosis not present

## 2016-03-17 DIAGNOSIS — E210 Primary hyperparathyroidism: Secondary | ICD-10-CM | POA: Diagnosis not present

## 2016-05-07 ENCOUNTER — Other Ambulatory Visit: Payer: Self-pay | Admitting: Family Medicine

## 2016-05-14 ENCOUNTER — Ambulatory Visit: Payer: PPO | Admitting: Family Medicine

## 2016-06-10 DIAGNOSIS — G4710 Hypersomnia, unspecified: Secondary | ICD-10-CM | POA: Diagnosis not present

## 2016-06-10 DIAGNOSIS — E559 Vitamin D deficiency, unspecified: Secondary | ICD-10-CM | POA: Diagnosis not present

## 2016-06-10 DIAGNOSIS — E210 Primary hyperparathyroidism: Secondary | ICD-10-CM | POA: Diagnosis not present

## 2016-06-28 DIAGNOSIS — T148XXA Other injury of unspecified body region, initial encounter: Secondary | ICD-10-CM | POA: Diagnosis not present

## 2016-06-28 DIAGNOSIS — S81012A Laceration without foreign body, left knee, initial encounter: Secondary | ICD-10-CM | POA: Diagnosis not present

## 2016-06-28 DIAGNOSIS — S50312A Abrasion of left elbow, initial encounter: Secondary | ICD-10-CM | POA: Diagnosis not present

## 2016-06-28 DIAGNOSIS — F0390 Unspecified dementia without behavioral disturbance: Secondary | ICD-10-CM | POA: Diagnosis not present

## 2016-06-28 DIAGNOSIS — S0083XA Contusion of other part of head, initial encounter: Secondary | ICD-10-CM | POA: Diagnosis not present

## 2016-06-28 DIAGNOSIS — M25512 Pain in left shoulder: Secondary | ICD-10-CM | POA: Diagnosis not present

## 2016-06-28 DIAGNOSIS — W1830XA Fall on same level, unspecified, initial encounter: Secondary | ICD-10-CM | POA: Diagnosis not present

## 2016-06-28 DIAGNOSIS — S40012A Contusion of left shoulder, initial encounter: Secondary | ICD-10-CM | POA: Diagnosis not present

## 2016-06-28 DIAGNOSIS — S4992XA Unspecified injury of left shoulder and upper arm, initial encounter: Secondary | ICD-10-CM | POA: Diagnosis not present

## 2016-06-28 DIAGNOSIS — S0990XA Unspecified injury of head, initial encounter: Secondary | ICD-10-CM | POA: Diagnosis not present

## 2016-06-28 DIAGNOSIS — S199XXA Unspecified injury of neck, initial encounter: Secondary | ICD-10-CM | POA: Diagnosis not present

## 2016-06-28 DIAGNOSIS — S80212A Abrasion, left knee, initial encounter: Secondary | ICD-10-CM | POA: Diagnosis not present

## 2016-06-28 DIAGNOSIS — Y92009 Unspecified place in unspecified non-institutional (private) residence as the place of occurrence of the external cause: Secondary | ICD-10-CM | POA: Diagnosis not present

## 2016-07-22 ENCOUNTER — Ambulatory Visit: Payer: PPO | Admitting: Family Medicine

## 2016-07-22 ENCOUNTER — Ambulatory Visit: Payer: PPO

## 2016-07-31 ENCOUNTER — Telehealth: Payer: Self-pay | Admitting: Family Medicine

## 2016-07-31 NOTE — Telephone Encounter (Signed)
Spoke to patients wife to r/s AWV. She states she will call when she can have her son bring Mr.Merilynn Finlandobertson. States he is doing well right now. Call back number provided. - Tiffany,LPN

## 2016-08-11 ENCOUNTER — Ambulatory Visit (INDEPENDENT_AMBULATORY_CARE_PROVIDER_SITE_OTHER): Payer: PPO

## 2016-08-11 ENCOUNTER — Ambulatory Visit (INDEPENDENT_AMBULATORY_CARE_PROVIDER_SITE_OTHER): Payer: PPO | Admitting: Family Medicine

## 2016-08-11 VITALS — BP 118/62 | HR 80 | Temp 98.6°F | Resp 18 | Ht 67.0 in | Wt 128.8 lb

## 2016-08-11 DIAGNOSIS — F0280 Dementia in other diseases classified elsewhere without behavioral disturbance: Secondary | ICD-10-CM

## 2016-08-11 DIAGNOSIS — Z0000 Encounter for general adult medical examination without abnormal findings: Secondary | ICD-10-CM | POA: Diagnosis not present

## 2016-08-11 DIAGNOSIS — I10 Essential (primary) hypertension: Secondary | ICD-10-CM | POA: Diagnosis not present

## 2016-08-11 DIAGNOSIS — G309 Alzheimer's disease, unspecified: Secondary | ICD-10-CM | POA: Diagnosis not present

## 2016-08-11 NOTE — Progress Notes (Signed)
Patient: Larry LegatoHarold E Alvi Male    DOB: 1923/11/18   83 y.o.   MRN: [id number] Visit Date: 08/11/2016  Today's Provider: Megan Mansichard  Jr, MD   Chief Complaint  Patient presents with  . Hypertension  . Hyperlipidemia  . Dementia   Subjective:    HPI  Hypertension, follow-up:  BP Readings from Last 3 Encounters:  08/11/16 118/62  03/03/16 (!) 116/58  02/12/16 132/64    He was last seen for hypertension 4 months ago.  BP at that visit was 116/58. Management since that visit includes no changes. He reports good compliance with treatment. He is not having side effects.  He is not exercising. He is adherent to low salt diet.   Outside blood pressures are checked daily. He is experiencing none.  Patient denies exertional chest pressure/discomfort and lower extremity edema.   Cardiovascular risk factors include dyslipidemia.     Weight trend: stable Wt Readings from Last 3 Encounters:  08/11/16 128 lb 12.8 oz (58.4 kg)  02/12/16 134 lb (60.8 kg)  11/06/15 134 lb (60.8 kg)    Current diet: well balanced     Lipid/Cholesterol, Follow-up:   Last seen for this4 months ago.  Management changes since that visit include no changes. . Last Lipid Panel:    Component Value Date/Time   CHOL 131 11/07/2015 0920   TRIG 109 11/07/2015 0920   HDL 39 (L) 11/07/2015 0920   LDLCALC 70 11/07/2015 0920    Risk factors for vascular disease include hypertension  He reports good compliance with treatment. He is not having side effects.  Current symptoms include none and have been stable. Weight trend: stable Prior visit with dietician: no Current diet: well balanced Current exercise: none  Wt Readings from Last 3 Encounters:  08/11/16 128 lb 12.8 oz (58.4 kg)  02/12/16 134 lb (60.8 kg)  11/06/15 134 lb (60.8 kg)     Dementia, follow up: Patient was last seen in the office 4 months ago. No changes were made in his medications. Patient is currently taking  Aricept, and he is tolerating the medication well.     MMSE - Mini Mental State Exam 08/11/2016  Orientation to time 0  Orientation to Place 2  Registration 0  Attention/ Calculation 0  Recall 0  Language- name 2 objects 2  Language- repeat 1  Language- follow 3 step command 3  Language- read & follow direction 1  Write a sentence 1  Copy design 0  Total score 10    No Known Allergies   Current Outpatient Prescriptions:  .  amLODipine (NORVASC) 5 MG tablet, Take 1 tablet (5 mg total) by mouth at bedtime., Disp: 90 tablet, Rfl: 3 .  Cholecalciferol (VITAMIN D-1000 MAX ST) 1000 units tablet, Take by mouth., Disp: , Rfl:  .  clopidogrel (PLAVIX) 75 MG tablet, TAKE 1 TABLET DAILY, Disp: 90 tablet, Rfl: 3 .  donepezil (ARICEPT) 10 MG tablet, TAKE 1 TABLET DAILY (Patient not taking: Reported on 08/11/2016), Disp: 90 tablet, Rfl: 3 .  mirtazapine (REMERON) 30 MG tablet, Take 1 tablet (30 mg total) by mouth at bedtime. (Patient not taking: Reported on 08/11/2016), Disp: 90 tablet, Rfl: 1  Review of Systems  Constitutional: Negative.   HENT: Negative.   Eyes: Negative.   Cardiovascular: Negative.   Endocrine: Negative.   Musculoskeletal: Negative.   Allergic/Immunologic: Negative.   Neurological: Negative.   Psychiatric/Behavioral: Negative.     Social History  Substance Use Topics  .  Smoking status: Former Smoker    Quit date: 03/30/1951  . Smokeless tobacco: Never Used  . Alcohol use No   Objective:     BP  118/62 (BP Location: Right Arm, Patient Position: Sitting)     Pulse  80     Temp  98.6 F (37 C)     Resp  18     Ht  5\' 7"  (1.702 m)      Wt  128 lb 12.8 oz (58.4 kg)   BMI  20.17 kg/m       Physical Exam  Constitutional: He is oriented to person, place, and time. He appears well-developed and well-nourished.  HENT:  Head: Normocephalic and atraumatic.  Right Ear: External ear normal.  Left Ear: External ear normal.  Nose: Nose normal.    Eyes: Conjunctivae are normal.  Cardiovascular: Normal rate and normal heart sounds.   Pulmonary/Chest: Effort normal and breath sounds normal.  Abdominal: Soft.  Neurological: He is alert and oriented to person, place, and time.  Skin: Skin is warm and dry.  Psychiatric: He has a normal mood and affect. His behavior is normal. Judgment and thought content normal.        Assessment & Plan:     1. Essential hypertension   2. Alzheimer's dementia without behavioral disturbance, unspecified timing of dementia onset Moderate/severe.Consider Hospice in future.More than 50% of 25 minute visit spent in counselling. 3.HLD Stop pravachol to simplify regimen.     I have done the exam and reviewed the above chart and it is accurate to the best of my knowledge. Dentist has been used in this note in any air is in the dictation or transcription are unintentional.  Megan Mans, MD  [facility]

## 2016-08-11 NOTE — Progress Notes (Signed)
Subjective:   Larry Davidson is a 83 y.o. male who presents for Medicare Annual/Subsequent preventive examination.  Review of Systems:   Cardiac Risk Factors include: hypertension;advanced age (>7555men, 56>65 women)     Objective:    Vitals: BP 118/62 (BP Location: Right Arm, Patient Position: Sitting)   Pulse 80   Temp 98.6 F (37 C)   Resp 18   Ht 5\' 7"  (1.702 m)   Wt 128 lb 12.8 oz (58.4 kg)   BMI 20.17 kg/m   Body mass index is 20.17 kg/m.  Tobacco History  Smoking Status  . Former Smoker  . Quit date: 03/30/1951  Smokeless Tobacco  . Never Used     Counseling given: Not Answered   History reviewed. No pertinent past medical history. History reviewed. No pertinent surgical history. History reviewed. No pertinent family history. History  Sexual Activity  . Sexual activity: Not on file    Outpatient Encounter Prescriptions as of 08/11/2016  Medication Sig  . amLODipine (NORVASC) 5 MG tablet Take 1 tablet (5 mg total) by mouth at bedtime.  . Cholecalciferol (VITAMIN D-1000 MAX ST) 1000 units tablet Take by mouth.  . clopidogrel (PLAVIX) 75 MG tablet TAKE 1 TABLET DAILY  . donepezil (ARICEPT) 10 MG tablet TAKE 1 TABLET DAILY (Patient not taking: Reported on 08/11/2016)  . mirtazapine (REMERON) 30 MG tablet Take 1 tablet (30 mg total) by mouth at bedtime. (Patient not taking: Reported on 08/11/2016)  . [DISCONTINUED] pravastatin (PRAVACHOL) 40 MG tablet Take 40 mg by mouth daily.   . [DISCONTINUED] pravastatin (PRAVACHOL) 40 MG tablet TAKE 1 TABLET (40 MG TOTAL) BY MOUTH AT BEDTIME. (Patient not taking: Reported on 08/11/2016)   No facility-administered encounter medications on file as of 08/11/2016.     Activities of Daily Living In your present state of health, do you have any difficulty performing the following activities: 08/11/2016 11/06/2015  Hearing? N Y  Vision? N N  Difficulty concentrating or making decisions? Malvin JohnsY Y  Walking or climbing stairs? Y Y    Dressing or bathing? Y Y  Doing errands, shopping? Malvin JohnsY Y  Preparing Food and eating ? N -  Using the Toilet? N -  In the past six months, have you accidently leaked urine? N -  Do you have problems with loss of bowel control? N -  Managing your Medications? Y -  Managing your Finances? Y -  Housekeeping or managing your Housekeeping? Y -  Some recent data might be hidden    Patient Care Team: Maple HudsonGilbert, Richard L Jr., MD as PCP - General (Family Medicine) Abisogun, Albin FellingAbby Tubman, MD as Referring Physician (Internal Medicine)   Assessment:     Exercise Activities and Dietary recommendations Current Exercise Habits: The patient does not participate in regular exercise at present, Exercise limited by: psychological condition(s)  Goals    . Increase water intake          Recommend drinking 2-3 glasses of water a day.      Fall Risk Fall Risk  08/11/2016 11/06/2015  Falls in the past year? Yes Yes  Number falls in past yr: 2 or more 2 or more  Injury with Fall? No No  Risk Factor Category  High Fall Risk -  Risk for fall due to : History of fall(s) -  Follow up Falls prevention discussed -   Depression Screen PHQ 2/9 Scores 08/11/2016 11/06/2015  PHQ - 2 Score 6 0  PHQ- 9 Score 21 -  Cognitive Function     6CIT Screen 08/11/2016  What Year? 0 points  What month? 0 points  What time? 3 points  Count back from 20 2 points  Months in reverse 4 points  Repeat phrase 10 points  Total Score 19    Immunization History  Administered Date(s) Administered  . Influenza, High Dose Seasonal PF 01/16/2016  . Pneumococcal Conjugate-13 09/27/2013  . Pneumococcal Polysaccharide-23 12/25/2015   Screening Tests Health Maintenance  Topic Date Due  . INFLUENZA VACCINE  10/28/2016  . TETANUS/TDAP  06/20/2026  . PNA vac Low Risk Adult  Completed      Plan:    I have personally reviewed and addressed the Medicare Annual Wellness questionnaire and have noted the following in the  patient's chart:  A. Medical and social history B. Use of alcohol, tobacco or illicit drugs  C. Current medications and supplements D. Functional ability and status E.  Nutritional status F.  Physical activity G. Advance directives H. List of other physicians I.  Hospitalizations, surgeries, and ER visits in previous 12 months J.  Vitals K. Screenings such as hearing and vision if needed, cognitive and depression L. Referrals and appointments - none  In addition, I have reviewed and discussed with patient certain preventive protocols, quality metrics, and best practice recommendations. A written personalized care plan for preventive services as well as general preventive health recommendations were provided to patient.  See attached scanned questionnaire for additional information.   Signed,  Marin Roberts, LPN Nurse Health Advisor   MD Recommendations: Fall/ depression screening score is high (family states patient is sleeping all day)- pt stopped taking his Remeron due to sleeping to much. Patient failed 6CIT exam today.  I have reviewed the health advisors note, was  available for consultation and I agree with documentation and plan. Julieanne Manson MD [facility]

## 2016-08-11 NOTE — Patient Instructions (Signed)
[name] , Thank you for taking time to come for your Medicare Wellness Visit. I appreciate your ongoing commitment to your health goals. Please review the following plan we discussed and let me know if I can assist you in the future.   Screening recommendations/referrals: Colonoscopy: completed 05/09/2008, no longer required Recommended yearly ophthalmology/optometry visit for glaucoma screening and checkup Recommended yearly dental visit for hygiene and checkup  Vaccinations: Influenza vaccine: up to date, due 12/2015 Pneumococcal vaccine: completed series Tdap vaccine: up to date Shingles vaccine: due now, check with your insurance company for coverage.   Advanced directives: Please bring a copy to the office at your convenience.   Conditions/risks identified: Recommend drinking 2-3 glasses of water a day.  Next appointment: none, follow up in one year for annual wellness exam   Preventive Care 65 Years and Older, Male Preventive care refers to lifestyle choices and visits with your health care provider that can promote health and wellness. What does preventive care include?  A yearly physical exam. This is also called an annual well check.  Dental exams once or twice a year.  Routine eye exams. Ask your health care provider how often you should have your eyes checked.  Personal lifestyle choices, including:  Daily care of your teeth and gums.  Regular physical activity.  Eating a healthy diet.  Avoiding tobacco and drug use.  Limiting alcohol use.  Practicing safe sex.  Taking low doses of aspirin every day.  Taking vitamin and mineral supplements as recommended by your health care provider. What happens during an annual well check? The services and screenings done by your health care provider during your annual well check will depend on your age, overall health, lifestyle risk factors, and family history of disease. Counseling  Your health care provider may  ask you questions about your:  Alcohol use.  Tobacco use.  Drug use.  Emotional well-being.  Home and relationship well-being.  Sexual activity.  Eating habits.  History of falls.  Memory and ability to understand (cognition).  Work and work Astronomerenvironment. Screening  You may have the following tests or measurements:  Height, weight, and BMI.  Blood pressure.  Lipid and cholesterol levels. These may be checked every 5 years, or more frequently if you are over 81 years old.  Skin check.  Lung cancer screening. You may have this screening every year starting at age 81 if you have a 30-pack-year history of smoking and currently smoke or have quit within the past 15 years.  Fecal occult blood test (FOBT) of the stool. You may have this test every year starting at age 81.  Flexible sigmoidoscopy or colonoscopy. You may have a sigmoidoscopy every 5 years or a colonoscopy every 10 years starting at age 81.  Prostate cancer screening. Recommendations will vary depending on your family history and other risks.  Hepatitis C blood test.  Hepatitis B blood test.  Sexually transmitted disease (STD) testing.  Diabetes screening. This is done by checking your blood sugar (glucose) after you have not eaten for a while (fasting). You may have this done every 1-3 years.  Abdominal aortic aneurysm (AAA) screening. You may need this if you are a current or former smoker.  Osteoporosis. You may be screened starting at age 81 if you are at high risk. Talk with your health care provider about your test results, treatment options, and if necessary, the need for more tests. Vaccines  Your health care provider may recommend certain vaccines, such  as:  Influenza vaccine. This is recommended every year.  Tetanus, diphtheria, and acellular pertussis (Tdap, Td) vaccine. You may need a Td booster every 10 years.  Zoster vaccine. You may need this after age 27.  Pneumococcal 13-valent  conjugate (PCV13) vaccine. One dose is recommended after age 101.  Pneumococcal polysaccharide (PPSV23) vaccine. One dose is recommended after age 1. Talk to your health care provider about which screenings and vaccines you need and how often you need them. This information is not intended to replace advice given to you by your health care provider. Make sure you discuss any questions you have with your health care provider. Document Released: 04/12/2015 Document Revised: 12/04/2015 Document Reviewed: 01/15/2015 Elsevier Interactive Patient Education  2017 [place] Prevention in the Home Falls can cause injuries. They can happen to people of all ages. There are many things you can do to make your home safe and to help prevent falls. What can I do on the outside of my home?  Regularly fix the edges of walkways and driveways and fix any cracks.  Remove anything that might make you trip as you walk through a door, such as a raised step or threshold.  Trim any bushes or trees on the path to your home.  Use bright outdoor lighting.  Clear any walking paths of anything that might make someone trip, such as rocks or tools.  Regularly check to see if handrails are loose or broken. Make sure that both sides of any steps have handrails.  Any raised decks and porches should have guardrails on the edges.  Have any leaves, snow, or ice cleared regularly.  Use sand or salt on walking paths during winter.  Clean up any spills in your garage right away. This includes oil or grease spills. What can I do in the bathroom?  Use night lights.  Install grab bars by the toilet and in the tub and shower. Do not use towel bars as grab bars.  Use non-skid mats or decals in the tub or shower.  If you need to sit down in the shower, use a plastic, non-slip stool.  Keep the floor dry. Clean up any water that spills on the floor as soon as it happens.  Remove soap buildup in the tub or  shower regularly.  Attach bath mats securely with double-sided non-slip rug tape.  Do not have throw rugs and other things on the floor that can make you trip. What can I do in the bedroom?  Use night lights.  Make sure that you have a light by your bed that is easy to reach.  Do not use any sheets or blankets that are too big for your bed. They should not hang down onto the floor.  Have a firm chair that has side arms. You can use this for support while you get dressed.  Do not have throw rugs and other things on the floor that can make you trip. What can I do in the kitchen?  Clean up any spills right away.  Avoid walking on wet floors.  Keep items that you use a lot in easy-to-reach places.  If you need to reach something above you, use a strong step stool that has a grab bar.  Keep electrical cords out of the way.  Do not use floor polish or wax that makes floors slippery. If you must use wax, use non-skid floor wax.  Do not have throw rugs and other things on the  floor that can make you trip. What can I do with my stairs?  Do not leave any items on the stairs.  Make sure that there are handrails on both sides of the stairs and use them. Fix handrails that are broken or loose. Make sure that handrails are as long as the stairways.  Check any carpeting to make sure that it is firmly attached to the stairs. Fix any carpet that is loose or worn.  Avoid having throw rugs at the top or bottom of the stairs. If you do have throw rugs, attach them to the floor with carpet tape.  Make sure that you have a light switch at the top of the stairs and the bottom of the stairs. If you do not have them, ask someone to add them for you. What else can I do to help prevent falls?  Wear shoes that:  Do not have high heels.  Have rubber bottoms.  Are comfortable and fit you well.  Are closed at the toe. Do not wear sandals.  If you use a stepladder:  Make sure that it is fully  opened. Do not climb a closed stepladder.  Make sure that both sides of the stepladder are locked into place.  Ask someone to hold it for you, if possible.  Clearly mark and make sure that you can see:  Any grab bars or handrails.  First and last steps.  Where the edge of each step is.  Use tools that help you move around (mobility aids) if they are needed. These include:  Canes.  Walkers.  Scooters.  Crutches.  Turn on the lights when you go into a dark area. Replace any light bulbs as soon as they burn out.  Set up your furniture so you have a clear path. Avoid moving your furniture around.  If any of your floors are uneven, fix them.  If there are any pets around you, be aware of where they are.  Review your medicines with your doctor. Some medicines can make you feel dizzy. This can increase your chance of falling. Ask your doctor what other things that you can do to help prevent falls. This information is not intended to replace advice given to you by your health care provider. Make sure you discuss any questions you have with your health care provider. Document Released: 01/10/2009 Document Revised: 08/22/2015 Document Reviewed: 04/20/2014 Elsevier Interactive Patient Education  2017 [employer].

## 2016-08-18 DIAGNOSIS — M545 Low back pain: Secondary | ICD-10-CM | POA: Diagnosis not present

## 2016-08-18 DIAGNOSIS — R4182 Altered mental status, unspecified: Secondary | ICD-10-CM | POA: Diagnosis not present

## 2016-08-19 ENCOUNTER — Emergency Department
Admission: EM | Admit: 2016-08-19 | Discharge: 2016-08-19 | Disposition: A | Discharge status: Home / Self Care | Payer: PPO | Attending: Emergency Medicine | Admitting: Emergency Medicine

## 2016-08-19 DIAGNOSIS — I10 Essential (primary) hypertension: Secondary | ICD-10-CM | POA: Insufficient documentation

## 2016-08-19 DIAGNOSIS — Z79899 Other long term (current) drug therapy: Secondary | ICD-10-CM | POA: Insufficient documentation

## 2016-08-19 DIAGNOSIS — R339 Retention of urine, unspecified: Secondary | ICD-10-CM | POA: Insufficient documentation

## 2016-08-19 DIAGNOSIS — Z87891 Personal history of nicotine dependence: Secondary | ICD-10-CM | POA: Insufficient documentation

## 2016-08-19 LAB — CBC
HCT: 41.8 % (ref 40.0–52.0)
HEMOGLOBIN: 14.1 g/dL (ref 13.0–18.0)
MCH: 30 pg (ref 26.0–34.0)
MCHC: 33.6 g/dL (ref 32.0–36.0)
MCV: 89.3 fL (ref 80.0–100.0)
PLATELETS: 198 10*3/uL (ref 150–440)
RBC: 4.68 MIL/uL (ref 4.40–5.90)
RDW: 17 % — ABNORMAL HIGH (ref 11.5–14.5)
WBC: 11 10*3/uL — ABNORMAL HIGH (ref 3.8–10.6)

## 2016-08-19 LAB — COMPREHENSIVE METABOLIC PANEL
ALBUMIN: 3.9 g/dL (ref 3.5–5.0)
ALK PHOS: 62 U/L (ref 38–126)
ALT: 13 U/L — ABNORMAL LOW (ref 17–63)
ANION GAP: 8 (ref 5–15)
AST: 29 U/L (ref 15–41)
BUN: 16 mg/dL (ref 6–20)
CALCIUM: 10.5 mg/dL — AB (ref 8.9–10.3)
CHLORIDE: 106 mmol/L (ref 101–111)
CO2: 25 mmol/L (ref 22–32)
Creatinine, Ser: 1.15 mg/dL (ref 0.61–1.24)
GFR calc Af Amer: >60 mL/min (ref >60)
GFR calc non Af Amer: 54 mL/min — ABNORMAL LOW (ref >60)
GLUCOSE: 102 mg/dL — AB (ref 65–99)
POTASSIUM: 3.8 mmol/L (ref 3.5–5.1)
SODIUM: 139 mmol/L (ref 135–145)
Total Bilirubin: 0.9 mg/dL (ref 0.3–1.2)
Total Protein: 6.7 g/dL (ref 6.5–8.1)

## 2016-08-19 LAB — URINALYSIS, COMPLETE (UACMP) WITH MICROSCOPIC
BILIRUBIN URINE: NEGATIVE
Glucose, UA: 50 mg/dL — AB
KETONES UR: NEGATIVE mg/dL
LEUKOCYTES UA: NEGATIVE
NITRITE: NEGATIVE
PROTEIN: NEGATIVE mg/dL
Specific Gravity, Urine: 1.008 (ref 1.005–1.030)
pH: 6 (ref 5.0–8.0)

## 2016-08-19 LAB — TROPONIN I

## 2016-08-19 NOTE — ED Provider Notes (Signed)
[facility] Emergency Department Provider Note   ____________________________________________   First MD Initiated Contact with Patient 08/19/16 0041     (approximate)  I have reviewed the triage vital signs and the nursing notes.   HISTORY  Chief Complaint Urinary Incontinence and Encopresis  The patient has a history of dementia  HPI Larry Davidson is a 83 y.o. male who was brought into the hospital today with bowel incontinence and either urinary retention or urinary incontinence. EMS reports that initially they were told it was retention but then they turned it into incontinence. EMS states that the wife give the patient some Metamucil today but she doesn't see well and she thinks she may have given him too much. They report that he did have some bowel incontinence. The patient also has a tremor and some back pain. He is afebrile. He does wear depends but he is here today for evaluation. The patient is unable to give any history at this time.   History reviewed. No pertinent past medical history.  Patient Active Problem List   Diagnosis Date Noted  . Hypertension 11/06/2015  . Dementia 11/06/2015  . Hyperlipidemia 11/06/2015    History reviewed. No pertinent surgical history.  Prior to Admission medications   Medication Sig Start Date End Date Taking? Authorizing Provider  amLODipine (NORVASC) 5 MG tablet Take 1 tablet (5 mg total) by mouth at bedtime. 11/06/15   Maple Hudson., MD  Cholecalciferol (VITAMIN D-1000 MAX ST) 1000 units tablet Take by mouth.    [provider]  clopidogrel (PLAVIX) 75 MG tablet TAKE 1 TABLET DAILY 12/03/15   Maple Hudson., MD  donepezil (ARICEPT) 10 MG tablet TAKE 1 TABLET DAILY Patient not taking: Reported on 08/11/2016 12/10/15   Maple Hudson., MD  mirtazapine (REMERON) 30 MG tablet Take 1 tablet (30 mg total) by mouth at bedtime. Patient not taking: Reported on 08/11/2016 11/12/15    Maple Hudson., MD    Allergies Patient has no known allergies.  No family history on file.  Social History Social History  Substance Use Topics  . Smoking status: Former Smoker    Quit date: 03/30/1951  . Smokeless tobacco: Never Used  . Alcohol use No    Review of Systems Constitutional: No fever/chills Eyes: No visual changes. ENT: No sore throat. Cardiovascular: Denies chest pain. Respiratory: Denies shortness of breath. Gastrointestinal:  abdominal pain.  No nausea, no vomiting.  No diarrhea.  No constipation. Genitourinary: urinary retention Musculoskeletal: Negative for back pain. Skin: Negative for rash. Neurological: Negative for headaches, focal weakness or numbness.   ____________________________________________   PHYSICAL EXAM:  VITAL SIGNS: ED Triage Vitals  Enc Vitals Group     BP 08/19/16 0044 (!) 159/87     Pulse Rate 08/19/16 0044 88     Resp 08/19/16 0044 18     Temp 08/19/16 0044 98.3 F (36.8 C)     Temp Source 08/19/16 0044 Oral     SpO2 08/19/16 0044 96 %     Weight 08/19/16 0045 130 lb (59 kg)     Height 08/19/16 0045 5\' 9"  (1.753 m)     Head Circumference --      Peak Flow --      Pain Score --      Pain Loc --      Pain Edu? --      Excl. in GC? --     Constitutional: Alert and oriented  only to self. Well appearing and in no acute distress. Eyes: Conjunctivae are normal. PERRL. EOMI. Head: Atraumatic. Nose: No congestion/rhinnorhea. Mouth/Throat: Mucous membranes are moist.  Oropharynx non-erythematous. Cardiovascular: Normal rate, regular rhythm. Grossly normal heart sounds.  Good peripheral circulation. Respiratory: Normal respiratory effort.  No retractions. Lungs CTAB. Gastrointestinal: Soft and nontender. Mild lower abd distension, positive bowel sounds Musculoskeletal: No lower extremity tenderness nor edema.   Neurologic:  Normal speech and language.  Skin:  Skin is warm, dry and intact.  Psychiatric: Mood and  affect are normal. .  ____________________________________________   LABS (all labs ordered are listed, but only abnormal results are displayed)  Labs Reviewed  CBC - Abnormal; Notable for the following:       Result Value   WBC 11.0 (*)    RDW 17.0 (*)    All other components within normal limits  COMPREHENSIVE METABOLIC PANEL - Abnormal; Notable for the following:    Glucose, Bld 102 (*)    Calcium 10.5 (*)    ALT 13 (*)    GFR calc non Af Amer 54 (*)    All other components within normal limits  URINALYSIS, COMPLETE (UACMP) WITH MICROSCOPIC - Abnormal; Notable for the following:    Color, Urine YELLOW (*)    APPearance CLEAR (*)    Glucose, UA 50 (*)    Hgb urine dipstick MODERATE (*)    Bacteria, UA RARE (*)    Squamous Epithelial / LPF 0-5 (*)    All other components within normal limits  TROPONIN I   ____________________________________________  EKG  none ____________________________________________  RADIOLOGY  none ____________________________________________   PROCEDURES  Procedure(s) performed: None  Procedures  Critical Care performed: No  ____________________________________________   INITIAL IMPRESSION / ASSESSMENT AND PLAN / ED COURSE  Pertinent labs & imaging results that were available during my care of the patient were reviewed by me and considered in my medical decision making (see chart for details).  This is a 83 year old male who comes into the hospital today with what seems to be urinary retention. When I did press on the patient's bladder urine did come from his penis. His bladder is also large. We will perform a bladder scan and then place a Foley catheter. I will check some blood work and await the arrival of the patient's family for more information.     Once the patient's family arrived they report that he kept stating that he had to urinate all day he has been unable to do so. They report that whenever he went to the bathroom he  would only dribble and he was very uncomfortable and could not rest. He hadn't urinated well for the entire day. His bladder scan did show over 700 ML's of urine in his bladder and once the Foley was placed his discomfort was relieved. The patient went to sleep and his family states that this is the best he had been all day. The patient's blood work is unremarkable and his urinalysis does not show any infection. I will discharge the patient home with his catheter in place. The patient's wife and son were concerned but I informed him that should we remove the catheter he would not be able to urinate and ended up in the same situation. He does need to follow-up with urology. Otherwise the patient's family has no further concerns and he'll be discharged home. ____________________________________________   FINAL CLINICAL IMPRESSION(S) / ED DIAGNOSES  Final diagnoses:  Urinary retention  NEW MEDICATIONS STARTED DURING THIS VISIT:  Discharge Medication List as of 08/19/2016  3:13 AM       Note:  This document was prepared using Dragon voice recognition software and may include unintentional dictation errors.    Rebecka ApleyWebster, Allison P, MD 08/19/16 [phone]

## 2016-08-19 NOTE — ED Triage Notes (Signed)
Pt arrives to ED from home via [facility] with c/o urinary retention, urinary incontinence, bowel incontinence, back pain, and tremors. EMS reports pt's wife gives him Metamucil for contipation and thinks she might have given too much "because she can't see very well." EMS reports pt has mild AMS at baseline.

## 2016-08-19 NOTE — ED Notes (Signed)
Bladder scan= .

## 2016-08-19 NOTE — Discharge Instructions (Signed)
Please follow up with urology

## 2016-08-31 DIAGNOSIS — R339 Retention of urine, unspecified: Secondary | ICD-10-CM | POA: Insufficient documentation

## 2016-08-31 NOTE — Progress Notes (Signed)
09/01/2016 8:11 AM   Larry Davidson [birth year] [id number]  Referring provider: Maple HudsonGilbert, Richard L Jr., MD [address] [place], [place] [address]  CC: New Patient, urinary retention  HPI:  1. Urinary Retention - New urinary retention 07/2016 at ER visit, PVR 800mL and foley placed. Not placed on any new meds. Slowly progressive bother from obstructive voding. DRE 08/2016 70gm smooth.  2 - Right Hydrocele - large right hydrocele stable x decades, non- bothersome.   PMH sig for dementia, CVA/Plavix, weight loss / failure to thrive (help in most ADL's from wife who he lives independantly with). His PCP is Dr. Sullivan LoneGilbert.   Today "Larry Davidson" is seen as new patient for above. He is referred by ER for retention but was placed on no new meds to actually help with retention.    PMH: No past medical history on file.  Surgical History: No past surgical history on file.  Home Medications:  Allergies as of 09/01/2016   No Known Allergies     Medication List       Accurate as of 08/31/16  8:11 AM. Always use your most recent med list.          amLODipine 5 MG tablet Commonly known as:  NORVASC Take 1 tablet (5 mg total) by mouth at bedtime.   clopidogrel 75 MG tablet Commonly known as:  PLAVIX TAKE 1 TABLET DAILY   donepezil 10 MG tablet Commonly known as:  ARICEPT TAKE 1 TABLET DAILY   mirtazapine 30 MG tablet Commonly known as:  REMERON Take 1 tablet (30 mg total) by mouth at bedtime.   VITAMIN D-1000 MAX ST 1000 units tablet Generic drug:  Cholecalciferol Take by mouth.       Allergies: No Known Allergies  Family History: No family history on file.  Social History:  reports that he quit smoking about 65 years ago. He has never used smokeless tobacco. He reports that he does not drink alcohol or use drugs.    Review of Systems  Gastrointestinal (upper)  : Negative for upper GI symptoms  Gastrointestinal (lower) : Negative for lower GI  symptoms  Constitutional : Negative for symptoms  Skin: Negative for skin symptoms  Eyes: Negative for eye symptoms  Ear/Nose/Throat : Negative for Ear/Nose/Throat symptoms  Hematologic/Lymphatic: Negative for Hematologic/Lymphatic symptoms  Cardiovascular : Negative for cardiovascular symptoms  Respiratory : Negative for respiratory symptoms  Endocrine: Negative for endocrine symptoms  Musculoskeletal: Negative for musculoskeletal symptoms  Neurological: Negative for neurological symptoms  Psychologic: Negative for psychiatric symptoms   Physical Exam: There were no vitals taken for this visit.  Constitutional:  Alert and oriented, No acute distress. Stigmata of weight loss and moderate dementia.  HEENT: Ashley AT, moist mucus membranes.  Trachea midline, no masses. Cardiovascular: No clubbing, cyanosis, or edema. Respiratory: Normal respiratory effort, no increased work of breathing. GI: Abdomen is soft, nontender, nondistended, no abdominal masses GU: No CVA tenderness. Large Rt Hydrocele. DRE 70gm smooth. In situ catheter with clear yellow urine.  Skin: No rashes, bruises or suspicious lesions. Lymph: No cervical or inguinal adenopathy. Neurologic: Grossly intact, no focal deficits, moving all 4 extremities. Psychiatric: Normal mood and affect.  Laboratory Data: Lab Results  Component Value Date   WBC 11.0 (H) 08/19/2016   HGB 14.1 08/19/2016   HCT 41.8 08/19/2016   MCV 89.3 08/19/2016   PLT 198 08/19/2016    Lab Results  Component Value Date   CREATININE 1.15 08/19/2016    No  results found for: PSA  No results found for: TESTOSTERONE  No results found for: HGBA1C  Urinalysis    Component Value Date/Time   COLORURINE YELLOW (A) 08/19/2016 0050   APPEARANCEUR CLEAR (A) 08/19/2016 0050   LABSPEC 1.008 08/19/2016 0050   PHURINE 6.0 08/19/2016 0050   GLUCOSEU 50 (A) 08/19/2016 0050   HGBUR MODERATE (A) 08/19/2016 0050   BILIRUBINUR NEGATIVE  08/19/2016 0050   KETONESUR NEGATIVE 08/19/2016 0050   PROTEINUR NEGATIVE 08/19/2016 0050   NITRITE NEGATIVE 08/19/2016 0050   LEUKOCYTESUR NEGATIVE 08/19/2016 0050    Pertinent Imaging: Bladder scan as per HPI  Assessment & Plan:   1. Urinary retention - likely from BPH in setting of recent constipation. Rec begin tamsulosin + finasterdie and then trial of void in 2 weeks. If fails, then try again in another 4 weeks. If fails again, will need cysto and urodynamics.   2 - Right Hydrocele - stable and asymptomatic, observe.    Sebastian Ache, MD  [facility] [address], [address] [place], [place] [address] [phone]

## 2016-09-01 ENCOUNTER — Ambulatory Visit: Payer: PPO | Admitting: Urology

## 2016-09-01 VITALS — BP 135/76 | HR 102 | Ht 69.0 in | Wt 123.0 lb

## 2016-09-01 DIAGNOSIS — R339 Retention of urine, unspecified: Secondary | ICD-10-CM | POA: Diagnosis not present

## 2016-09-01 DIAGNOSIS — N433 Hydrocele, unspecified: Secondary | ICD-10-CM

## 2016-09-01 MED ORDER — TAMSULOSIN HCL 0.4 MG PO CAPS: 0.4000 mg | ORAL_CAPSULE | Freq: Every day | ORAL | 3 refills | Status: DC

## 2016-09-01 MED ORDER — FINASTERIDE 5 MG PO TABS: 5.0000 mg | ORAL_TABLET | Freq: Every day | ORAL | 3 refills | Status: DC

## 2016-09-14 ENCOUNTER — Ambulatory Visit (INDEPENDENT_AMBULATORY_CARE_PROVIDER_SITE_OTHER): Payer: PPO | Admitting: Urology

## 2016-09-14 ENCOUNTER — Encounter: Payer: Self-pay | Admitting: Urology

## 2016-09-14 VITALS — BP 142/74 | HR 86 | Ht 67.0 in | Wt 123.0 lb

## 2016-09-14 DIAGNOSIS — R339 Retention of urine, unspecified: Secondary | ICD-10-CM | POA: Diagnosis not present

## 2016-09-14 NOTE — Progress Notes (Signed)
09/14/2016 2:45 PM   Larry Davidson 1923/12/02 [id number]  Referring provider: Maple HudsonGilbert, Richard L Jr., MD [address] [place], [place] [address]  Chief Complaint  Patient presents with  . Urinary Retention    New Patient    HPI: Patient was assessed on June 5 by Dr. Berneice HeinrichManny for retention with a residual of 700 mL. He had an asymptomatic right hydrocele. He was started on finasteride and Flomax on that day and is here for a trial of voiding    The patient is on both medications. No complications from catheter. Incomplete bladder emptying is stable. Clinically noninfected   PMH: Past Medical History:  Diagnosis Date  . Arthritis   . Hypertension   . Stroke [facility](HCC)     Surgical History: No past surgical history on file.  Home Medications:  Allergies as of 09/14/2016   No Known Allergies     Medication List       Accurate as of 09/14/16  2:45 PM. Always use your most recent med list.          amLODipine 5 MG tablet Commonly known as:  NORVASC Take 1 tablet (5 mg total) by mouth at bedtime.   clopidogrel 75 MG tablet Commonly known as:  PLAVIX TAKE 1 TABLET DAILY   donepezil 10 MG tablet Commonly known as:  ARICEPT TAKE 1 TABLET DAILY   finasteride 5 MG tablet Commonly known as:  PROSCAR Take 1 tablet (5 mg total) by mouth daily.   mirtazapine 30 MG tablet Commonly known as:  REMERON Take 1 tablet (30 mg total) by mouth at bedtime.   tamsulosin 0.4 MG Caps capsule Commonly known as:  FLOMAX Take 1 capsule (0.4 mg total) by mouth daily.   VITAMIN D-1000 MAX ST 1000 units tablet Generic drug:  Cholecalciferol Take by mouth.       Allergies: No Known Allergies  Family History: Family History  Problem Relation Age of Onset  . Prostate cancer Neg Hx   . Bladder Cancer Neg Hx   . Kidney cancer Neg Hx     Social History:  reports that he quit smoking about 65 years ago. He has never used smokeless tobacco. He reports that he does  not drink alcohol or use drugs.  ROS: UROLOGY Frequent Urination?: No Hard to postpone urination?: No Burning/pain with urination?: No Get up at night to urinate?: No Leakage of urine?: Yes Urine stream starts and stops?: No Trouble starting stream?: No Do you have to strain to urinate?: No Blood in urine?: No Urinary tract infection?: No Sexually transmitted disease?: No Injury to kidneys or bladder?: No Painful intercourse?: No Weak stream?: No Erection problems?: No Penile pain?: No  Gastrointestinal Nausea?: No Vomiting?: No Indigestion/heartburn?: No Diarrhea?: No Constipation?: No  Constitutional Fever: No Night sweats?: No Weight loss?: No Fatigue?: No  Skin Skin rash/lesions?: No Itching?: No  Eyes Blurred vision?: No Double vision?: No  Ears/Nose/Throat Sore throat?: No Sinus problems?: No  Hematologic/Lymphatic Swollen glands?: No Easy bruising?: No  Cardiovascular Leg swelling?: No Chest pain?: No  Respiratory Cough?: No Shortness of breath?: No  Endocrine Excessive thirst?: No  Musculoskeletal Back pain?: No Joint pain?: No  Neurological Headaches?: No Dizziness?: No  Psychologic Depression?: No Anxiety?: No  Physical Exam: BP (!) 142/74   Pulse 86   Ht 5\' 7"  (1.702 m)   Wt 55.8 kg (123 lb)   BMI 19.26 kg/m   Constitutional:  Alert and oriented, No acute distress.  Laboratory Data: Lab Results  Component Value Date   WBC 11.0 (H) 08/19/2016   HGB 14.1 08/19/2016   HCT 41.8 08/19/2016   MCV 89.3 08/19/2016   PLT 198 08/19/2016    Lab Results  Component Value Date   CREATININE 1.15 08/19/2016    No results found for: PSA  No results found for: TESTOSTERONE  No results found for: HGBA1C  Urinalysis    Component Value Date/Time   COLORURINE YELLOW (A) 08/19/2016 0050   APPEARANCEUR CLEAR (A) 08/19/2016 0050   LABSPEC 1.008 08/19/2016 0050   PHURINE 6.0 08/19/2016 0050   GLUCOSEU 50 (A) 08/19/2016  0050   HGBUR MODERATE (A) 08/19/2016 0050   BILIRUBINUR NEGATIVE 08/19/2016 0050   KETONESUR NEGATIVE 08/19/2016 0050   PROTEINUR NEGATIVE 08/19/2016 0050   NITRITE NEGATIVE 08/19/2016 0050   LEUKOCYTESUR NEGATIVE 08/19/2016 0050    Pertinent Imaging: none  Assessment & Plan:  The patient today had a good voiding trial with a low residual. I will see him on combination prostate therapy in about 2 months  There are no diagnoses linked to this encounter.  No Follow-up on file.  Martina Sinner, MD  [facility] [address], [address] [place], [place] [address] [phone]

## 2016-09-14 NOTE — Progress Notes (Signed)
Catheter Removal  Patient is present today for a catheter removal.  8ml of water was drained from the balloon. A 16FR foley cath was removed from the bladder no complications were noted . Patient tolerated well.  Preformed by: Eligha BridegroomSarah Watts, CMA  Follow up/ Additional notes: PM for PVR  Bladder Scan Patient can void: 52 ml Performed By: Eligha BridegroomSarah Watts, CMA  Per Dr. Sherron MondayMacDiarmid patient to continue tamsulosin and finesteride and follow up in two months

## 2016-09-21 ENCOUNTER — Emergency Department: Payer: PPO

## 2016-09-21 ENCOUNTER — Inpatient Hospital Stay: Payer: PPO

## 2016-09-21 ENCOUNTER — Encounter: Payer: Self-pay | Admitting: Emergency Medicine

## 2016-09-21 ENCOUNTER — Ambulatory Visit: Payer: PPO

## 2016-09-21 ENCOUNTER — Inpatient Hospital Stay
Admission: EM | Admit: 2016-09-21 | Discharge: 2016-09-25 | DRG: 698 | Disposition: A | Discharge status: Hospice | Payer: PPO | Attending: Internal Medicine | Admitting: Internal Medicine

## 2016-09-21 DIAGNOSIS — R6520 Severe sepsis without septic shock: Secondary | ICD-10-CM | POA: Diagnosis not present

## 2016-09-21 DIAGNOSIS — R253 Fasciculation: Secondary | ICD-10-CM | POA: Diagnosis not present

## 2016-09-21 DIAGNOSIS — R7989 Other specified abnormal findings of blood chemistry: Secondary | ICD-10-CM | POA: Diagnosis not present

## 2016-09-21 DIAGNOSIS — R627 Adult failure to thrive: Secondary | ICD-10-CM | POA: Diagnosis not present

## 2016-09-21 DIAGNOSIS — R531 Weakness: Secondary | ICD-10-CM | POA: Diagnosis not present

## 2016-09-21 DIAGNOSIS — N400 Enlarged prostate without lower urinary tract symptoms: Secondary | ICD-10-CM | POA: Diagnosis present

## 2016-09-21 DIAGNOSIS — E872 Acidosis, unspecified: Secondary | ICD-10-CM

## 2016-09-21 DIAGNOSIS — E785 Hyperlipidemia, unspecified: Secondary | ICD-10-CM | POA: Diagnosis not present

## 2016-09-21 DIAGNOSIS — A4151 Sepsis due to Escherichia coli [E. coli]: Secondary | ICD-10-CM | POA: Diagnosis not present

## 2016-09-21 DIAGNOSIS — R4182 Altered mental status, unspecified: Secondary | ICD-10-CM | POA: Diagnosis not present

## 2016-09-21 DIAGNOSIS — F0390 Unspecified dementia without behavioral disturbance: Secondary | ICD-10-CM | POA: Diagnosis not present

## 2016-09-21 DIAGNOSIS — Z87440 Personal history of urinary (tract) infections: Secondary | ICD-10-CM | POA: Diagnosis not present

## 2016-09-21 DIAGNOSIS — R569 Unspecified convulsions: Secondary | ICD-10-CM | POA: Diagnosis not present

## 2016-09-21 DIAGNOSIS — Z79899 Other long term (current) drug therapy: Secondary | ICD-10-CM

## 2016-09-21 DIAGNOSIS — E876 Hypokalemia: Secondary | ICD-10-CM | POA: Diagnosis not present

## 2016-09-21 DIAGNOSIS — Z87891 Personal history of nicotine dependence: Secondary | ICD-10-CM

## 2016-09-21 DIAGNOSIS — N320 Bladder-neck obstruction: Secondary | ICD-10-CM | POA: Diagnosis not present

## 2016-09-21 DIAGNOSIS — N289 Disorder of kidney and ureter, unspecified: Secondary | ICD-10-CM

## 2016-09-21 DIAGNOSIS — I248 Other forms of acute ischemic heart disease: Secondary | ICD-10-CM

## 2016-09-21 DIAGNOSIS — I10 Essential (primary) hypertension: Secondary | ICD-10-CM | POA: Diagnosis present

## 2016-09-21 DIAGNOSIS — Z7189 Other specified counseling: Secondary | ICD-10-CM

## 2016-09-21 DIAGNOSIS — R778 Other specified abnormalities of plasma proteins: Secondary | ICD-10-CM

## 2016-09-21 DIAGNOSIS — D72829 Elevated white blood cell count, unspecified: Secondary | ICD-10-CM

## 2016-09-21 DIAGNOSIS — R5383 Other fatigue: Secondary | ICD-10-CM | POA: Diagnosis not present

## 2016-09-21 DIAGNOSIS — N179 Acute kidney failure, unspecified: Secondary | ICD-10-CM | POA: Diagnosis present

## 2016-09-21 DIAGNOSIS — T83511A Infection and inflammatory reaction due to indwelling urethral catheter, initial encounter: Secondary | ICD-10-CM | POA: Diagnosis not present

## 2016-09-21 DIAGNOSIS — N390 Urinary tract infection, site not specified: Secondary | ICD-10-CM | POA: Diagnosis present

## 2016-09-21 DIAGNOSIS — R0902 Hypoxemia: Secondary | ICD-10-CM | POA: Diagnosis present

## 2016-09-21 DIAGNOSIS — R4189 Other symptoms and signs involving cognitive functions and awareness: Secondary | ICD-10-CM

## 2016-09-21 DIAGNOSIS — A419 Sepsis, unspecified organism: Secondary | ICD-10-CM

## 2016-09-21 DIAGNOSIS — Z515 Encounter for palliative care: Secondary | ICD-10-CM

## 2016-09-21 DIAGNOSIS — Z66 Do not resuscitate: Secondary | ICD-10-CM | POA: Diagnosis not present

## 2016-09-21 DIAGNOSIS — R339 Retention of urine, unspecified: Secondary | ICD-10-CM | POA: Diagnosis not present

## 2016-09-21 DIAGNOSIS — R338 Other retention of urine: Secondary | ICD-10-CM

## 2016-09-21 DIAGNOSIS — Z681 Body mass index (BMI) 19 or less, adult: Secondary | ICD-10-CM

## 2016-09-21 DIAGNOSIS — Z8673 Personal history of transient ischemic attack (TIA), and cerebral infarction without residual deficits: Secondary | ICD-10-CM

## 2016-09-21 DIAGNOSIS — I6782 Cerebral ischemia: Secondary | ICD-10-CM | POA: Diagnosis not present

## 2016-09-21 DIAGNOSIS — N200 Calculus of kidney: Secondary | ICD-10-CM | POA: Diagnosis not present

## 2016-09-21 DIAGNOSIS — Z7902 Long term (current) use of antithrombotics/antiplatelets: Secondary | ICD-10-CM

## 2016-09-21 DIAGNOSIS — I517 Cardiomegaly: Secondary | ICD-10-CM | POA: Diagnosis not present

## 2016-09-21 HISTORY — DX: Unspecified dementia, unspecified severity, without behavioral disturbance, psychotic disturbance, mood disturbance, and anxiety: F03.90

## 2016-09-21 LAB — URINALYSIS, ROUTINE W REFLEX MICROSCOPIC
Bilirubin Urine: NEGATIVE
GLUCOSE, UA: NEGATIVE mg/dL
Ketones, ur: NEGATIVE mg/dL
NITRITE: NEGATIVE
PROTEIN: 30 mg/dL — AB
Specific Gravity, Urine: 1.012 (ref 1.005–1.030)
Squamous Epithelial / LPF: NONE SEEN
pH: 5 (ref 5.0–8.0)

## 2016-09-21 LAB — COMPREHENSIVE METABOLIC PANEL
ALT: 13 U/L — ABNORMAL LOW (ref 17–63)
ANION GAP: 9 (ref 5–15)
AST: 26 U/L (ref 15–41)
Albumin: 3.1 g/dL — ABNORMAL LOW (ref 3.5–5.0)
Alkaline Phosphatase: 55 U/L (ref 38–126)
BILIRUBIN TOTAL: 0.9 mg/dL (ref 0.3–1.2)
BUN: 29 mg/dL — ABNORMAL HIGH (ref 6–20)
CO2: 24 mmol/L (ref 22–32)
Calcium: 9.6 mg/dL (ref 8.9–10.3)
Chloride: 106 mmol/L (ref 101–111)
Creatinine, Ser: 1.65 mg/dL — ABNORMAL HIGH (ref 0.61–1.24)
GFR, EST AFRICAN AMERICAN: 41 mL/min — AB (ref >60)
GFR, EST NON AFRICAN AMERICAN: 35 mL/min — AB (ref >60)
Glucose, Bld: 120 mg/dL — ABNORMAL HIGH (ref 65–99)
POTASSIUM: 3.4 mmol/L — AB (ref 3.5–5.1)
Sodium: 139 mmol/L (ref 135–145)
TOTAL PROTEIN: 6.5 g/dL (ref 6.5–8.1)

## 2016-09-21 LAB — CBC WITH DIFFERENTIAL/PLATELET
BASOS PCT: 0 %
Basophils Absolute: 0 10*3/uL (ref 0–0.1)
EOS ABS: 0 10*3/uL (ref 0–0.7)
Eosinophils Relative: 0 %
HCT: 38.1 % — ABNORMAL LOW (ref 40.0–52.0)
HEMOGLOBIN: 12.7 g/dL — AB (ref 13.0–18.0)
Lymphocytes Relative: 3 %
Lymphs Abs: 0.4 10*3/uL — ABNORMAL LOW (ref 1.0–3.6)
MCH: 30.1 pg (ref 26.0–34.0)
MCHC: 33.3 g/dL (ref 32.0–36.0)
MCV: 90.4 fL (ref 80.0–100.0)
Monocytes Absolute: 1 10*3/uL (ref 0.2–1.0)
Monocytes Relative: 6 %
NEUTROS PCT: 91 %
Neutro Abs: 14.8 10*3/uL — ABNORMAL HIGH (ref 1.4–6.5)
PLATELETS: 185 10*3/uL (ref 150–440)
RBC: 4.22 MIL/uL — AB (ref 4.40–5.90)
RDW: 14.8 % — ABNORMAL HIGH (ref 11.5–14.5)
WBC: 16.2 10*3/uL — AB (ref 3.8–10.6)

## 2016-09-21 LAB — PROTIME-INR
INR: 1.11
PROTHROMBIN TIME: 14.4 s (ref 11.4–15.2)

## 2016-09-21 LAB — PROCALCITONIN: PROCALCITONIN: 13.28 ng/mL

## 2016-09-21 LAB — TROPONIN I
TROPONIN I: 0.06 ng/mL — AB (ref <0.03)
Troponin I: 0.04 ng/mL (ref <0.03)
Troponin I: 0.05 ng/mL (ref <0.03)

## 2016-09-21 LAB — LACTIC ACID, PLASMA
LACTIC ACID, VENOUS: 2 mmol/L — AB (ref 0.5–1.9)
Lactic Acid, Venous: 2.2 mmol/L (ref 0.5–1.9)

## 2016-09-21 LAB — LIPASE, BLOOD: LIPASE: 22 U/L (ref 11–51)

## 2016-09-21 MED ORDER — MIRTAZAPINE 15 MG PO TABS
30.0000 mg | ORAL_TABLET | Freq: Every day | ORAL | Status: DC
  Administered 2016-09-22 – 2016-09-24 (×3): 30 mg via ORAL
  Filled 2016-09-21 (×6): qty 2

## 2016-09-21 MED ORDER — ACETAMINOPHEN 10 MG/ML IV SOLN
1000.0000 mg | Freq: Once | INTRAVENOUS | Status: AC
  Administered 2016-09-21: 1000 mg via INTRAVENOUS
  Filled 2016-09-21: qty 100

## 2016-09-21 MED ORDER — PIPERACILLIN-TAZOBACTAM 3.375 G IVPB
3.3750 g | Freq: Three times a day (TID) | INTRAVENOUS | Status: DC
  Administered 2016-09-21 – 2016-09-22 (×2): 3.375 g via INTRAVENOUS
  Filled 2016-09-21 (×4): qty 50

## 2016-09-21 MED ORDER — ACETAMINOPHEN 325 MG PO TABS
650.0000 mg | ORAL_TABLET | Freq: Four times a day (QID) | ORAL | Status: DC | PRN
  Filled 2016-09-21: qty 2

## 2016-09-21 MED ORDER — ACETAMINOPHEN 650 MG RE SUPP
650.0000 mg | Freq: Four times a day (QID) | RECTAL | Status: DC | PRN
Start: 2016-09-21 — End: 2016-09-25
  Filled 2016-09-21: qty 1

## 2016-09-21 MED ORDER — AMLODIPINE BESYLATE 5 MG PO TABS
5.0000 mg | ORAL_TABLET | Freq: Every day | ORAL | Status: DC
  Administered 2016-09-22 – 2016-09-24 (×3): 5 mg via ORAL
  Filled 2016-09-21 (×5): qty 1

## 2016-09-21 MED ORDER — POTASSIUM CHLORIDE CRYS ER 20 MEQ PO TBCR: 40.0000 meq | EXTENDED_RELEASE_TABLET | Freq: Once | ORAL | Status: DC

## 2016-09-21 MED ORDER — TAMSULOSIN HCL 0.4 MG PO CAPS
0.4000 mg | ORAL_CAPSULE | Freq: Every day | ORAL | Status: DC
  Administered 2016-09-22 – 2016-09-24 (×3): 0.4 mg via ORAL
  Filled 2016-09-21 (×3): qty 1

## 2016-09-21 MED ORDER — BISACODYL 5 MG PO TBEC
5.0000 mg | DELAYED_RELEASE_TABLET | Freq: Every day | ORAL | Status: DC | PRN
  Filled 2016-09-21: qty 1

## 2016-09-21 MED ORDER — CLOPIDOGREL BISULFATE 75 MG PO TABS
75.0000 mg | ORAL_TABLET | Freq: Every day | ORAL | Status: DC
  Administered 2016-09-22 – 2016-09-24 (×3): 75 mg via ORAL
  Filled 2016-09-21 (×3): qty 1

## 2016-09-21 MED ORDER — ENOXAPARIN SODIUM 40 MG/0.4ML ~~LOC~~ SOLN: 40.0000 mg | SUBCUTANEOUS | Status: DC

## 2016-09-21 MED ORDER — SENNOSIDES-DOCUSATE SODIUM 8.6-50 MG PO TABS
1.0000 | ORAL_TABLET | Freq: Every evening | ORAL | Status: DC | PRN
  Filled 2016-09-21: qty 1

## 2016-09-21 MED ORDER — IBUPROFEN 400 MG PO TABS
400.0000 mg | ORAL_TABLET | Freq: Four times a day (QID) | ORAL | Status: DC | PRN
  Filled 2016-09-21: qty 1

## 2016-09-21 MED ORDER — POTASSIUM CHLORIDE 10 MEQ/100ML IV SOLN
10.0000 meq | INTRAVENOUS | Status: AC
  Administered 2016-09-21 (×4): 10 meq via INTRAVENOUS
  Filled 2016-09-21 (×4): qty 100

## 2016-09-21 MED ORDER — VANCOMYCIN HCL IN DEXTROSE 1-5 GM/200ML-% IV SOLN
1000.0000 mg | Freq: Once | INTRAVENOUS | Status: AC
  Administered 2016-09-21: 1000 mg via INTRAVENOUS
  Filled 2016-09-21: qty 200

## 2016-09-21 MED ORDER — DONEPEZIL HCL 5 MG PO TABS
10.0000 mg | ORAL_TABLET | Freq: Every day | ORAL | Status: DC
  Administered 2016-09-22 – 2016-09-24 (×3): 10 mg via ORAL
  Filled 2016-09-21 (×4): qty 2

## 2016-09-21 MED ORDER — PIPERACILLIN-TAZOBACTAM 3.375 G IVPB 30 MIN
3.3750 g | Freq: Once | INTRAVENOUS | Status: AC
  Administered 2016-09-21: 3.375 g via INTRAVENOUS
  Filled 2016-09-21: qty 50

## 2016-09-21 MED ORDER — ONDANSETRON HCL 4 MG/2ML IJ SOLN
4.0000 mg | Freq: Four times a day (QID) | INTRAMUSCULAR | Status: DC | PRN
  Filled 2016-09-21: qty 2

## 2016-09-21 MED ORDER — ONDANSETRON HCL 4 MG PO TABS
4.0000 mg | ORAL_TABLET | Freq: Four times a day (QID) | ORAL | Status: DC | PRN
  Filled 2016-09-21: qty 1

## 2016-09-21 MED ORDER — FINASTERIDE 5 MG PO TABS
5.0000 mg | ORAL_TABLET | Freq: Every day | ORAL | Status: DC
  Administered 2016-09-22 – 2016-09-24 (×3): 5 mg via ORAL
  Filled 2016-09-21 (×3): qty 1

## 2016-09-21 MED ORDER — VANCOMYCIN HCL IN DEXTROSE 1-5 GM/200ML-% IV SOLN
1000.0000 mg | INTRAVENOUS | Status: DC
  Administered 2016-09-22: 06:00:00 1000 mg via INTRAVENOUS
  Filled 2016-09-21: qty 200

## 2016-09-21 MED ORDER — SODIUM CHLORIDE 0.9 % IV BOLUS (SEPSIS)
1000.0000 mL | Freq: Once | INTRAVENOUS | Status: AC
  Administered 2016-09-21: 1000 mL via INTRAVENOUS

## 2016-09-21 MED ORDER — SODIUM CHLORIDE 0.9 % IV SOLN
INTRAVENOUS | Status: DC
  Administered 2016-09-21: 14:00:00 via INTRAVENOUS

## 2016-09-21 NOTE — ED Notes (Signed)
Date and time results received: 09/21/16 1138  Test: Trop Critical Value: 0.04  Name of Provider Notified: Dr. Juliene PinaMody  Orders Received? Or Actions Taken?: Critical Value Acknowledged

## 2016-09-21 NOTE — Progress Notes (Signed)
Critical lab:  -Lactic Acid 2.2 -Troponin 0.06  Dr. Juliene PinaMody paged to be notified.

## 2016-09-21 NOTE — ED Triage Notes (Signed)
Pt presents to  ED via [facility] with c/o fever and altered mental status. Per EMS pt's wife states pt started with cough on Saturday night and was given a dose of robitussin yesterday morning. Pt's son was at the house yesterday until 0230 in the morning, after he left, pt attempted to go to the bathroom and was unable to get back to bed due to weakness. Pt's wife made him a pallet on the floor with blankets and patient slept on floor until son returned this morning. Per EMS pt was barely alert when they arrived with fever on 102.7axl. Pt is noted to be lethargic on arrival to ED.

## 2016-09-21 NOTE — Progress Notes (Signed)
Family Meeting Note  Advance Directive:yes  Today a meeting took place with the son, POA.  Patient is unable to participate due QM:VHQIONto:Lacked capacity demenita   The following clinical team members were present during this meeting:MD  The following were discussed:Patient's diagnosis: ,dementia Sepsis UTI  Patient's progosis: Unable to determine and Goals for treatment: hospice referal at home  Additional follow-up to be provided: hospice referral PT evalu   Time spent during discussion:20 minutes  Larry Scalia, MD

## 2016-09-21 NOTE — ED Notes (Signed)
Date and time results received: 09/21/16 1232   Test: Lactic Critical Value: 2  Name of Provider Notified: Dr. Lamont Snowballifenbark  Orders Received? Or Actions Taken?: Critical Value Acknowledged

## 2016-09-21 NOTE — ED Notes (Signed)
This RN to bedside, no change in patient condition. Pt continues to sleep at this time. Will continue to monitor for changes in patient condition.

## 2016-09-21 NOTE — Progress Notes (Addendum)
Bedside swallow performed. Immediately stopped after pt had one small sip of water and started coughing; very weak throat clearance. Dr. Juliene PinaMody paged. Speech consulted.

## 2016-09-21 NOTE — ED Notes (Signed)
Pt returned from radiology at this time.  

## 2016-09-21 NOTE — Progress Notes (Addendum)
Pharmacy Antibiotic Note  Larry Davidson is a 83 y.o. male admitted on 09/21/2016 with sepsis.  Pharmacy has been consulted for vancomycin and Zosyn dosing. Patient admitted via EMS with fever and AMS. Patient had cough starting am on 6/23. Patient received vancomycin and Zosyn x 1 in ED.    Plan: Will start patient on vancomycin 1g IV Q48hr for goal trough of 15-20. Will start next dose at 0600 on 6/26. Will follow renal function closely and obtain trough as clinically indicated.   Will start patient on Zosyn EI 3.375g IV Q8hr. Recommend transitioning patient to cefepime in setting of decreased renal function.   Height: 5\' 7"  (170.2 cm) Weight: 123 lb (55.8 kg) IBW/kg (Calculated) : 66.1  Temp (24hrs), Avg:101.7 F (38.7 C), Min:101.7 F (38.7 C), Max:101.7 F (38.7 C)   Recent Labs Lab 09/21/16 1049  WBC 16.2*  CREATININE 1.65*    Estimated Creatinine Clearance: 23.5 mL/min (A) (by C-G formula based on SCr of 1.65 mg/dL (H)).    No Known Allergies  Antimicrobials this admission: Vancomycin 6/25 >>  Zosyn 6/25 >> meropenem 6/26  Dose adjustments this admission: N/A  Microbiology results: 6/25 BCx: pending  6/25 UCx: pending   Thank you for allowing pharmacy to be a part of this patient's care.  Simpson,Michael L 09/21/2016 11:45 AM

## 2016-09-21 NOTE — Progress Notes (Signed)
Dr. Juliene PinaMody returned pages (pager was dead) & was notified of swallowing problem, Troponin 0.06, Lactic acid 2.2. See new orders.

## 2016-09-21 NOTE — Progress Notes (Signed)
Dr. Juliene PinaMody and on call MD pager paged [phone] since [phone]. Nursing supervisor notified to see how to get in touch with MD.

## 2016-09-21 NOTE — H&P (Addendum)
[facility] - [place] [facility]   PATIENT NAME: Larry Davidson    MR#:  [id number]  DATE OF BIRTH:  1923-08-28  DATE OF ADMISSION:  09/21/2016  PRIMARY CARE PHYSICIAN: Maple Hudson., MD   REQUESTING/REFERRING PHYSICIAN: dr Ardyth Man  CHIEF COMPLAINT:   fever HISTORY OF PRESENT ILLNESS:  Larry Davidson  is a 83 y.o. male with a known history of BPH status post Foley catheter removal about a week ago and dementia who presents with above complaint. Patient's son is at bedside who provides history of present illness. Patient has been having chills, fever and weakness over the past 24 hours. Patient's son checked on him yesterday when he had a fever. He came by again today and the patietn was very weak and fell to his knees while attempting to get out of the bathroom. He had a fever of 102 and was brought to the ED via EMS for further evaluation.  In the emergency room he is given  Vancomycin and Zosyn for sepsis.   PAST MEDICAL HISTORY:   Past Medical History:  Diagnosis Date  . Arthritis   . Dementia   . Hypertension   . Stroke [facility])     PAST SURGICAL HISTORY:  Inguinal hernia   SOCIAL HISTORY:   Social History  Substance Use Topics  . Smoking status: Former Smoker    Quit date: 03/30/1951  . Smokeless tobacco: Never Used  . Alcohol use No    FAMILY HISTORY:   Family History  Problem Relation Age of Onset  . Prostate cancer Neg Hx   . Bladder Cancer Neg Hx   . Kidney cancer Neg Hx     DRUG ALLERGIES:  No Known Allergies  REVIEW OF SYSTEMS:   Review of Systems  Unable to perform ROS: Dementia    MEDICATIONS AT HOME:   Prior to Admission medications   Medication Sig Start Date End Date Taking? Authorizing Provider  amLODipine (NORVASC) 5 MG tablet Take 1 tablet (5 mg total) by mouth at bedtime. 11/06/15   Maple Hudson., MD  Cholecalciferol (VITAMIN D-1000 MAX ST) 1000 units tablet Take by mouth.    [provider]  clopidogrel (PLAVIX) 75 MG tablet TAKE 1 TABLET DAILY 12/03/15   Maple Hudson., MD  donepezil (ARICEPT) 10 MG tablet TAKE 1 TABLET DAILY 12/10/15   Maple Hudson., MD  finasteride (PROSCAR) 5 MG tablet Take 1 tablet (5 mg total) by mouth daily. 09/01/16 09/01/17  Sebastian Ache, MD  mirtazapine (REMERON) 30 MG tablet Take 1 tablet (30 mg total) by mouth at bedtime. 11/12/15   Maple Hudson., MD  tamsulosin (FLOMAX) 0.4 MG CAPS capsule Take 1 capsule (0.4 mg total) by mouth daily. 09/01/16   Sebastian Ache, MD      VITAL SIGNS:  Blood pressure 124/70, pulse (!) 122, temperature (!) 101.7 F (38.7 C), temperature source Rectal, height 5\' 7"  (1.702 m), weight 55.8 kg (123 lb), SpO2 93 %.  PHYSICAL EXAMINATION:   Physical Exam  Constitutional: He is well-developed, well-nourished, and in no distress. No distress.  Frail thin week  HENT:  Head: Normocephalic.  Eyes: No scleral icterus.  Neck: Normal range of motion. Neck supple. No JVD present. No tracheal deviation present.  Cardiovascular: Regular rhythm.  Exam reveals no gallop and no friction rub.   Murmur heard. Tachycardia  Pulmonary/Chest: Effort normal and breath sounds normal. No respiratory distress. He has no wheezes. He has  no rales. He exhibits no tenderness.  Abdominal: Soft. Bowel sounds are normal. He exhibits no distension and no mass. There is no tenderness. There is no rebound and no guarding.  Musculoskeletal: Normal range of motion. He exhibits no edema.  Neurological: He is alert.  Oriented to name only  Skin: Skin is warm. No rash noted. No erythema.  Psychiatric: Affect and judgment normal.      LABORATORY PANEL:   CBC  Recent Labs Lab 09/21/16 1049  WBC 16.2*  HGB 12.7*  HCT 38.1*  PLT 185   ------------------------------------------------------------------------------------------------------------------  Chemistries   Recent Labs Lab 09/21/16 1049  NA 139  K  3.4*  CL 106  CO2 24  GLUCOSE 120*  BUN 29*  CREATININE 1.65*  CALCIUM 9.6  AST 26  ALT 13*  ALKPHOS 55  BILITOT 0.9   ------------------------------------------------------------------------------------------------------------------  Cardiac Enzymes  Recent Labs Lab 09/21/16 1049  TROPONINI 0.04*   ------------------------------------------------------------------------------------------------------------------  RADIOLOGY:  No results found.  EKG:  Sinus tachycardia   IMPRESSION AND PLAN:   83 year old male with dementia who presents with sepsis.    1. Sepsis due to UTI  Continue IV fluids Continue Rocephin Follow-up in urine and blood culture  2. Generalized weakness: Physical therapy consult for disposition  3. Elevated troponin due to demand ischemia from sepsis Follow troponins   4. Hypokalemia: Replete and recheck in a.m.  5. BPH: Continue Proscar and Flomax  6. Essential hypertension: Continue Norvasc  7. Dementia: Continue Aricept  8. AKI: Continue IV fluids. Repeat BMP in a.m. Hold nephrotoxic medications.  Patient's son would like referral to hospice at home.   All the records are reviewed and case discussed with ED provider. Management plans discussed with the patient's son and he is in agreement  CODE STATUS: DNR  TOTAL TIME TAKING CARE OF THIS PATIENT: 45 minutes.    Rashel Okeefe M.D on 09/21/2016 at 11:49 AM  Between 7am to 6pm - Pager - [phone]  After 6pm go to [email or web address] - Social research officer, governmentpassword [email or web address] [email or web address]  [facility]  Office  [phone]  CC: Primary care physician; Maple HudsonGilbert, Richard L Jr., MD

## 2016-09-21 NOTE — ED Provider Notes (Signed)
[facility] Emergency Department Provider Note  ____________________________________________   First MD Initiated Contact with Patient 09/21/16 1022     (approximate)  I have reviewed the triage vital signs and the nursing notes.   HISTORY  Chief Complaint Altered Mental Status  Level V exemption history Limited by the patient's altered mental status  HPI Larry Davidson is a 83 y.o. male who comes to the emergency department via EMS for fever and altered mental status for the last day or so. According to the patient's wife the patient has had roughly 2 days of cough and some shortness of breath. He has a long-standing history of multiple urinary tract infections. This morning the patient tried to get up to walk to the bathroom and he was too tired and laid down on the floor. Patient's wife assisted him back to bed and eventually called 911 not for fever but for altered mental status. When EMS arrived they noted his temperature was 102.7 axillary and he was tachycardic. They called a code sepsis in the field and gave him 1 L of crystalloid prior to arrival.   Past Medical History:  Diagnosis Date  . Arthritis   . Dementia   . Hypertension   . Stroke [facility])     Patient Active Problem List   Diagnosis Date Noted  . Sepsis (HCC) 09/21/2016  . Hydrocele, right 09/01/2016  . Urinary retention 08/31/2016  . Hypertension 11/06/2015  . Dementia 11/06/2015  . Hyperlipidemia 11/06/2015    History reviewed. No pertinent surgical history.  Prior to Admission medications   Medication Sig Start Date End Date Taking? Authorizing Provider  amLODipine (NORVASC) 5 MG tablet Take 1 tablet (5 mg total) by mouth at bedtime. 11/06/15   Maple Hudson., MD  Cholecalciferol (VITAMIN D-1000 MAX ST) 1000 units tablet Take by mouth.    [provider]  clopidogrel (PLAVIX) 75 MG tablet TAKE 1 TABLET DAILY 12/03/15   Maple Hudson., MD  donepezil  (ARICEPT) 10 MG tablet TAKE 1 TABLET DAILY 12/10/15   Maple Hudson., MD  finasteride (PROSCAR) 5 MG tablet Take 1 tablet (5 mg total) by mouth daily. 09/01/16 09/01/17  Sebastian Ache, MD  mirtazapine (REMERON) 30 MG tablet Take 1 tablet (30 mg total) by mouth at bedtime. 11/12/15   Maple Hudson., MD  tamsulosin (FLOMAX) 0.4 MG CAPS capsule Take 1 capsule (0.4 mg total) by mouth daily. 09/01/16   Sebastian Ache, MD    Allergies Patient has no known allergies.  Family History  Problem Relation Age of Onset  . Prostate cancer Neg Hx   . Bladder Cancer Neg Hx   . Kidney cancer Neg Hx     Social History Social History  Substance Use Topics  . Smoking status: Former Smoker    Quit date: 03/30/1951  . Smokeless tobacco: Never Used  . Alcohol use No    Review of Systems Level V exemption history Limited by the patient's altered mental status  ____________________________________________   PHYSICAL EXAM:  VITAL SIGNS: ED Triage Vitals  Enc Vitals Group     BP      Pulse      Resp      Temp      Temp src      SpO2      Weight      Height      Head Circumference      Peak Flow  Pain Score      Pain Loc      Pain Edu?      Excl. in GC?     Constitutional: Appears somewhat uncomfortable elevated respiratory rate significant dementia Eyes: PERRL EOMI. Head: Atraumatic. Nose: No congestion/rhinnorhea. Mouth/Throat: No trismus Neck: No stridor.   Cardiovascular: Tachycardic rate, regular rhythm. Grossly normal heart sounds.  Good peripheral circulation. Respiratory: Increased respiratory effort lungs largely clear Gastrointestinal: Soft nontender Musculoskeletal: No lower extremity edema   Neurologic:  . No gross focal neurologic deficits are appreciated. Skin:  Skin is warm, dry and intact. No rash noted.     ____________________________________________   DIFFERENTIAL includes but not limited to  Sepsis, septic shock, urinary tract infection,  pneumonia, intracerebral hemorrhage, bacteremia, appendicitis or pyelonephritis ____________________________________________   LABS (all labs ordered are listed, but only abnormal results are displayed)  Labs Reviewed  URINE CULTURE - Abnormal; Notable for the following:       Result Value   Culture >=100,000 COLONIES/mL GRAM NEGATIVE RODS (*)    All other components within normal limits  BLOOD CULTURE ID PANEL (REFLEXED) - Abnormal; Notable for the following:    Enterobacteriaceae species DETECTED (*)    Escherichia coli DETECTED (*)    All other components within normal limits  LACTIC ACID, PLASMA - Abnormal; Notable for the following:    Lactic Acid, Venous 2.0 (*)    All other components within normal limits  LACTIC ACID, PLASMA - Abnormal; Notable for the following:    Lactic Acid, Venous 2.2 (*)    All other components within normal limits  COMPREHENSIVE METABOLIC PANEL - Abnormal; Notable for the following:    Potassium 3.4 (*)    Glucose, Bld 120 (*)    BUN 29 (*)    Creatinine, Ser 1.65 (*)    Albumin 3.1 (*)    ALT 13 (*)    GFR calc non Af Amer 35 (*)    GFR calc Af Amer 41 (*)    All other components within normal limits  TROPONIN I - Abnormal; Notable for the following:    Troponin I 0.04 (*)    All other components within normal limits  CBC WITH DIFFERENTIAL/PLATELET - Abnormal; Notable for the following:    WBC 16.2 (*)    RBC 4.22 (*)    Hemoglobin 12.7 (*)    HCT 38.1 (*)    RDW 14.8 (*)    Neutro Abs 14.8 (*)    Lymphs Abs 0.4 (*)    All other components within normal limits  URINALYSIS, ROUTINE W REFLEX MICROSCOPIC - Abnormal; Notable for the following:    Color, Urine YELLOW (*)    APPearance HAZY (*)    Hgb urine dipstick LARGE (*)    Protein, ur 30 (*)    Leukocytes, UA TRACE (*)    Bacteria, UA MANY (*)    All other components within normal limits  TROPONIN I - Abnormal; Notable for the following:    Troponin I 0.06 (*)    All other  components within normal limits  TROPONIN I - Abnormal; Notable for the following:    Troponin I 0.05 (*)    All other components within normal limits  TROPONIN I - Abnormal; Notable for the following:    Troponin I 0.07 (*)    All other components within normal limits  CBC - Abnormal; Notable for the following:    WBC 14.4 (*)    RBC 4.01 (*)  Hemoglobin 12.3 (*)    HCT 36.1 (*)    RDW 15.3 (*)    All other components within normal limits  BASIC METABOLIC PANEL - Abnormal; Notable for the following:    BUN 31 (*)    Creatinine, Ser 1.46 (*)    GFR calc non Af Amer 41 (*)    GFR calc Af Amer 47 (*)    All other components within normal limits  CULTURE, BLOOD (ROUTINE X 2)  CULTURE, BLOOD (ROUTINE X 2)  LIPASE, BLOOD  PROCALCITONIN  PROTIME-INR    Urinalysis consistent with infection elevated troponin suggestive not of primary myocardial ischemia but likely secondary to demand __________________________________________  EKG  ED ECG REPORT I, Merrily BrittleNeil Tyeler Goedken, the attending physician, personally viewed and interpreted this ECG.  Date: 09/21/2016 Rate: 117 Rhythm: Sinus tachycardia QRS Axis: normal Intervals: normal ST/T Wave abnormalities: normal Narrative Interpretation: Abnormal  ____________________________________________  RADIOLOGY  Chest x-ray with possible right lower lung pleural effusion head CT with no acute disease   PROCEDURES  Procedure(s) performed: no  Procedures  Critical Care performed: yes  CRITICAL CARE Performed by: Merrily BrittleNeil Lamonte Hartt   Total critical care time: 35 minutes  Critical care time was exclusive of separately billable procedures and treating other patients.  Critical care was necessary to treat or prevent imminent or life-threatening deterioration.  Critical care was time spent personally by me on the following activities: development of treatment plan with patient and/or surrogate as well as nursing, discussions with  consultants, evaluation of patient's response to treatment, examination of patient, obtaining history from patient or surrogate, ordering and performing treatments and interventions, ordering and review of laboratory studies, ordering and review of radiographic studies, pulse oximetry and re-evaluation of patient's condition.   Observation: no ____________________________________________   INITIAL IMPRESSION / ASSESSMENT AND PLAN / ED COURSE  Pertinent labs & imaging results that were available during my care of the patient were reviewed by me and considered in my medical decision making (see chart for details).       The patient is febrile to 102.4 and tachycardic so we have called a code sepsis. He has already received 1 L of IV fluids prehospital and his 30 cc/kg would be roughly 1700 cc of fluid. I will give him one more liter now. Likely has urinary tract infection contributing to his sepsis. He will require inpatient admission for disposition pending head CT. I will treat him with our antibiotics for undifferentiated sepsis at this point we'll pending all of his results.  Fortunately the patient's head CT is negative for acute pathology. He will require inpatient admission for continued IV antibiotics. ____________________________________________   FINAL CLINICAL IMPRESSION(S) / ED DIAGNOSES  Final diagnoses:  Sepsis, due to unspecified organism (HCC)  Elevated troponin      NEW MEDICATIONS STARTED DURING THIS VISIT:  Current Discharge Medication List       Note:  This document was prepared using Dragon voice recognition software and may include unintentional dictation errors.     Merrily Brittleifenbark, Mckinlee Dunk, MD 09/22/16 1136

## 2016-09-22 ENCOUNTER — Telehealth: Payer: Self-pay | Admitting: Family Medicine

## 2016-09-22 DIAGNOSIS — Z7189 Other specified counseling: Secondary | ICD-10-CM

## 2016-09-22 DIAGNOSIS — Z515 Encounter for palliative care: Secondary | ICD-10-CM

## 2016-09-22 DIAGNOSIS — F0390 Unspecified dementia without behavioral disturbance: Secondary | ICD-10-CM

## 2016-09-22 DIAGNOSIS — A419 Sepsis, unspecified organism: Secondary | ICD-10-CM

## 2016-09-22 LAB — BLOOD CULTURE ID PANEL (REFLEXED)
ACINETOBACTER BAUMANNII: NOT DETECTED
CANDIDA ALBICANS: NOT DETECTED
CANDIDA TROPICALIS: NOT DETECTED
Candida glabrata: NOT DETECTED
Candida krusei: NOT DETECTED
Candida parapsilosis: NOT DETECTED
Carbapenem resistance: NOT DETECTED
ENTEROBACTER CLOACAE COMPLEX: NOT DETECTED
ENTEROBACTERIACEAE SPECIES: DETECTED — AB
ENTEROCOCCUS SPECIES: NOT DETECTED
Escherichia coli: DETECTED — AB
HAEMOPHILUS INFLUENZAE: NOT DETECTED
Klebsiella oxytoca: NOT DETECTED
Klebsiella pneumoniae: NOT DETECTED
LISTERIA MONOCYTOGENES: NOT DETECTED
NEISSERIA MENINGITIDIS: NOT DETECTED
PROTEUS SPECIES: NOT DETECTED
Pseudomonas aeruginosa: NOT DETECTED
STREPTOCOCCUS AGALACTIAE: NOT DETECTED
STREPTOCOCCUS SPECIES: NOT DETECTED
Serratia marcescens: NOT DETECTED
Staphylococcus aureus (BCID): NOT DETECTED
Staphylococcus species: NOT DETECTED
Streptococcus pneumoniae: NOT DETECTED
Streptococcus pyogenes: NOT DETECTED

## 2016-09-22 LAB — BASIC METABOLIC PANEL
ANION GAP: 7 (ref 5–15)
BUN: 31 mg/dL — ABNORMAL HIGH (ref 6–20)
CALCIUM: 9.5 mg/dL (ref 8.9–10.3)
CO2: 22 mmol/L (ref 22–32)
Chloride: 111 mmol/L (ref 101–111)
Creatinine, Ser: 1.46 mg/dL — ABNORMAL HIGH (ref 0.61–1.24)
GFR, EST AFRICAN AMERICAN: 47 mL/min — AB (ref >60)
GFR, EST NON AFRICAN AMERICAN: 41 mL/min — AB (ref >60)
GLUCOSE: 86 mg/dL (ref 65–99)
Potassium: 4.1 mmol/L (ref 3.5–5.1)
SODIUM: 140 mmol/L (ref 135–145)

## 2016-09-22 LAB — LACTIC ACID, PLASMA: Lactic Acid, Venous: 4.6 mmol/L (ref 0.5–1.9)

## 2016-09-22 LAB — CBC
HCT: 36.1 % — ABNORMAL LOW (ref 40.0–52.0)
Hemoglobin: 12.3 g/dL — ABNORMAL LOW (ref 13.0–18.0)
MCH: 30.6 pg (ref 26.0–34.0)
MCHC: 34.1 g/dL (ref 32.0–36.0)
MCV: 89.9 fL (ref 80.0–100.0)
PLATELETS: 165 10*3/uL (ref 150–440)
RBC: 4.01 MIL/uL — ABNORMAL LOW (ref 4.40–5.90)
RDW: 15.3 % — AB (ref 11.5–14.5)
WBC: 14.4 10*3/uL — AB (ref 3.8–10.6)

## 2016-09-22 LAB — TROPONIN I
Troponin I: 0.07 ng/mL (ref <0.03)
Troponin I: 0.07 ng/mL (ref <0.03)

## 2016-09-22 MED ORDER — MEROPENEM 500 MG IV SOLR
500.0000 mg | Freq: Two times a day (BID) | INTRAVENOUS | Status: DC
  Filled 2016-09-22 (×2): qty 0.5

## 2016-09-22 MED ORDER — MEROPENEM-SODIUM CHLORIDE 500 MG/50ML IV SOLR
500.0000 mg | Freq: Two times a day (BID) | INTRAVENOUS | Status: DC
  Filled 2016-09-22: qty 50

## 2016-09-22 MED ORDER — MEROPENEM 1 G IV SOLR
1.0000 g | Freq: Two times a day (BID) | INTRAVENOUS | Status: DC
  Administered 2016-09-22 – 2016-09-23 (×4): 1 g via INTRAVENOUS
  Filled 2016-09-22 (×6): qty 1

## 2016-09-22 MED ORDER — BUDESONIDE 0.25 MG/2ML IN SUSP
0.2500 mg | Freq: Two times a day (BID) | RESPIRATORY_TRACT | Status: DC
  Administered 2016-09-22 – 2016-09-25 (×6): 0.25 mg via RESPIRATORY_TRACT
  Filled 2016-09-22 (×7): qty 2

## 2016-09-22 MED ORDER — IPRATROPIUM-ALBUTEROL 0.5-2.5 (3) MG/3ML IN SOLN
3.0000 mL | RESPIRATORY_TRACT | Status: DC
  Administered 2016-09-22 – 2016-09-23 (×4): 3 mL via RESPIRATORY_TRACT
  Filled 2016-09-22 (×4): qty 3

## 2016-09-22 NOTE — Evaluation (Signed)
Physical Therapy Evaluation Patient Details Name: Larry LegatoHarold E Torbeck MRN: [id number] DOB: 1924-02-11 Today's Date: 09/22/2016   History of Present Illness  83 y.o. male with a known history of BPH status post Foley catheter removal about a week ago and dementia who presents with fever, admitted with sepsis.  Clinical Impression  Pt showed good effort despite some confusion and impulsivity.  He was able to do in-room ambulation w/o needing direct assist but was severely forward flexed and showed poor overall safety awareness or ability to consistently follow instructions.  He did do ~15 minutes of static standing and sit to stand balance acts with and w/o UE assist but pt was unable to consistently follow instruction or most recommendations.  Pt has a SPC at home, encouraged pt's family to start introducing this as he was unsteady and unpredictable with most mobility.  Family agrees that given his baseline mental status he does not have a lot of potential to gain much from HHPT but they will try to start getting him to use an AD of some kind as they are able.     Follow Up Recommendations No PT follow up;Supervision/Assistance - 24 hour    Equipment Recommendations       Recommendations for Other Services       Precautions / Restrictions Precautions Precautions: Fall Restrictions Weight Bearing Restrictions: No      Mobility  Bed Mobility Overal bed mobility: Modified Independent             General bed mobility comments: Pt somewhat slow with getting to EOB, but able to do so w/o direct assist  Transfers Overall transfer level: Modified independent Equipment used: None             General transfer comment: Pt able to get to standing w/o direct assist, has very poor posture (baseline) and is stooped and impulsive.  Ambulation/Gait Ambulation/Gait assistance: Min assist Ambulation Distance (Feet): 40 Feet Assistive device: None       General Gait Details: Pt with  forward hunched, impulsive but self-confident ambulation.  He was unsteady a not as stable as he seemed to feel, but overall he was able to show ability to negotiate the home w/o direct assist.   Stairs            Wheelchair Mobility    Modified Rankin (Stroke Patients Only)       Balance Overall balance assessment: Modified Independent;Needs assistance                                           Pertinent Vitals/Pain      Home Living Family/patient expects to be discharged to:: Private residence Living Arrangements: Spouse/significant other;Children Available Help at Discharge: Family (has home instead assist 2x/wk)   Home Access: Stairs to enter   Entrance Stairs-Number of Steps: 2   Home Equipment: Walker - standard;Cane - single point      Prior Function Level of Independence: Independent         Comments: Pt had good and bad days but apparently can typically walk in the home, use bathroom, etc w/o assist.  He has had 3 falls in the last 6 months.     Hand Dominance        Extremity/Trunk Assessment   Upper Extremity Assessment Upper Extremity Assessment: Overall WFL for tasks assessed (age appropriate limitations)  Lower Extremity Assessment Lower Extremity Assessment: Overall WFL for tasks assessed (age appropriate limitations)       Communication   Communication:  (baseline dementia)  Cognition Arousal/Alertness: Awake/alert (slow and inconsistent with answers) Behavior During Therapy: Impulsive Overall Cognitive Status: History of cognitive impairments - at baseline                                        General Comments      Exercises     Assessment/Plan    PT Assessment Patient needs continued PT services  PT Problem List Decreased strength;Decreased range of motion;Decreased activity tolerance;Decreased balance;Decreased mobility;Decreased coordination;Decreased safety awareness;Decreased  knowledge of use of DME;Decreased cognition       PT Treatment Interventions Gait training;Stair training;DME instruction;Functional mobility training;Therapeutic activities;Therapeutic exercise;Balance training;Cognitive remediation;Patient/family education    PT Goals (Current goals can be found in the Care Plan section)  Acute Rehab PT Goals Patient Stated Goal: go home PT Goal Formulation: With patient Time For Goal Achievement: 10/06/16 Potential to Achieve Goals: Fair    Frequency Min 2X/week   Barriers to discharge        Co-evaluation               AM-PAC PT "6 Clicks" Daily Activity  Outcome Measure Difficulty turning over in bed (including adjusting bedclothes, sheets and blankets)?: A Little Difficulty moving from lying on back to sitting on the side of the bed? : A Little Difficulty sitting down on and standing up from a chair with arms (e.g., wheelchair, bedside commode, etc,.)?: A Little Help needed moving to and from a bed to chair (including a wheelchair)?: A Little Help needed walking in hospital room?: A Little Help needed climbing 3-5 steps with a railing? : A Lot 6 Click Score: 17    End of Session Equipment Utilized During Treatment: Gait belt Activity Tolerance: Patient tolerated treatment well Patient left: with chair alarm set;with call bell/phone within reach;with family/visitor present Nurse Communication: Mobility status PT Visit Diagnosis: Muscle weakness (generalized) (M62.81);Difficulty in walking, not elsewhere classified (R26.2)    Time: 1308-6578 PT Time Calculation (min) (ACUTE ONLY): 43 min   Charges:   PT Evaluation $PT Eval Low Complexity: 1 Procedure PT Treatments $Therapeutic Activity: 8-22 mins   PT G Codes:        Malachi Pro, DPT 09/22/2016, 1:49 PM

## 2016-09-22 NOTE — Progress Notes (Signed)
Clinical Child psychotherapistocial Worker (CSW) received consult for hospice referral. CSW discussed case with MD and RN case Production designer, theatre/television/filmmanager. Per MD palliative consult will be ordered and disposition is undetermined at this time. CSW will continue to follow and assist as needed.   Baker Hughes IncorporatedBailey Angenette Daily, LCSW [phone]

## 2016-09-22 NOTE — Progress Notes (Addendum)
[facility] - [place] at [facility]   PATIENT NAME: Larry Davidson    MR#:  [id number]  DATE OF BIRTH:  [birth year]  SUBJECTIVE:  CHIEF COMPLAINT:   Chief Complaint  Patient presents with  . Altered Mental Status   Review of Systems  Unable to perform ROS: Dementia   The patient is a 83 year old Caucasian male who is with history of dementia, BPH, Foley catheter removal about a week ago, who presents to the hospital with fever, weakness, fall. On arrival he was noted to be tachycardic with heart rate around 120, febrile  To about 102. Labs revealed acute renal insufficiency with creatinine 1.65, hypokalemia, mild elevation of troponin, lactic acidosis, leukocytosis, pyuria. Hospitalist where consulted for admission. Patient was admitted and initiated on Zosyn. His labs revealed improvement, including improved. Her white blood cell count. Fever have subsided. Blood cultures revealed gram-negative rods, PCR was positive for Escherichia coli, urine culture was also positive for gram-negative rods   VITAL SIGNS: Blood pressure (!) 131/56, pulse 85, temperature 98.6 F (37 C), temperature source Oral, resp. rate (!) 21, height 5\' 7"  (1.702 m), weight 58.5 kg (129 lb), SpO2 96 %.  PHYSICAL EXAMINATION:   GENERAL:  83 y.o.-year-old patient sitting on the bed with no acute distress.  EYES: Pupils equal, round, reactive to light and accommodation. No scleral icterus. Extraocular muscles intact.  HEENT: Head atraumatic, normocephalic. Oropharynx and nasopharynx clear.  NECK:  Supple, no jugular venous distention. No thyroid enlargement, no tenderness.  LUNGS: Diminished breath sounds bilaterally, no wheezing, rales,rhonchi or crepitation. No use of accessory muscles of respiration.  CARDIOVASCULAR: S1, S2 normal. No murmurs, rubs, or gallops.  ABDOMEN: Soft, nontender, nondistended. Bowel sounds present. No organomegaly or mass.  EXTREMITIES: No pedal edema,  cyanosis, or clubbing.  NEUROLOGIC: Cranial nerves II through XII are intact. Muscle strength 5/5 in all extremities. Sensation intact. Gait not checked.  PSYCHIATRIC: The patient is alert and oriented x 3.  SKIN: No obvious rash, lesion, or ulcer.   ORDERS/RESULTS REVIEWED:   CBC  Recent Labs Lab 09/21/16 1049 09/22/16 0444  WBC 16.2* 14.4*  HGB 12.7* 12.3*  HCT 38.1* 36.1*  PLT 185 165  MCV 90.4 89.9  MCH 30.1 30.6  MCHC 33.3 34.1  RDW 14.8* 15.3*  LYMPHSABS 0.4*  --   MONOABS 1.0  --   EOSABS 0.0  --   BASOSABS 0.0  --    ------------------------------------------------------------------------------------------------------------------  Chemistries   Recent Labs Lab 09/21/16 1049 09/22/16 0444  NA 139 140  K 3.4* 4.1  CL 106 111  CO2 24 22  GLUCOSE 120* 86  BUN 29* 31*  CREATININE 1.65* 1.46*  CALCIUM 9.6 9.5  AST 26  --   ALT 13*  --   ALKPHOS 55  --   BILITOT 0.9  --    ------------------------------------------------------------------------------------------------------------------ estimated creatinine clearance is 27.8 mL/min (A) (by C-G formula based on SCr of 1.46 mg/dL (H)). ------------------------------------------------------------------------------------------------------------------ No results for input(s): TSH, T4TOTAL, T3FREE, THYROIDAB in the last 72 hours.  Invalid input(s): FREET3  Cardiac Enzymes  Recent Labs Lab 09/21/16 1340 09/21/16 1810 09/21/16 2344  TROPONINI 0.06* 0.05* 0.07*   ------------------------------------------------------------------------------------------------------------------ Invalid input(s): POCBNP ---------------------------------------------------------------------------------------------------------------  RADIOLOGY: Ct Head Wo Contrast  Result Date: 09/21/2016 CLINICAL DATA:  83 year old hypertensive male with fever and altered mental status. Initial encounter. EXAM: CT HEAD WITHOUT CONTRAST  TECHNIQUE: Contiguous axial images were obtained from the base of the skull through the vertex without intravenous contrast. COMPARISON:  01/09/2011 MR and CT. FINDINGS: Brain: No intracranial hemorrhage or CT evidence of large acute infarct. Remote moderate size left cerebellar infarct with encephalomalacia. Remote small right cerebellar infarct. Remote bilateral basal ganglia infarcts. Prominent chronic microvascular changes. Global atrophy without hydrocephalus. No intracranial mass lesion noted on this unenhanced exam. Vascular: Vascular calcifications. Skull: No skull fracture. Sinuses/Orbits: No acute orbital abnormality. Paranasal sinus mucosal thickening sparing the frontal sinuses. Other: Negative IMPRESSION: No intracranial hemorrhage or CT evidence of large acute infarct. Remote moderate size left cerebellar infarct with encephalomalacia. Remote small right cerebellar infarct. Remote bilateral basal ganglia infarcts. Prominent chronic microvascular changes. Global atrophy. Paranasal sinus mucosal thickening sparing the frontal sinuses. Electronically Signed   By: Lacy Duverney M.D.   On: 09/21/2016 12:02   Dg Chest Port 1 View  Result Date: 09/21/2016 CLINICAL DATA:  Fever.  Altered mental status. EXAM: PORTABLE CHEST 1 VIEW COMPARISON:  04/24/2013. FINDINGS: Cardiomegaly with normal pulmonary vascularity. Low lung volumes. Mild left base infiltrate cannot be excluded. Small right pleural effusion cannot be excluded. No pneumothorax . IMPRESSION: 1. Low lung volumes. Mild left base infiltrate cannot be excluded. Mild right pleural effusion cannot be excluded. 2.  Cardiomegaly.  No pulmonary venous congestion . Electronically Signed   By: Maisie Fus  Register   On: 09/21/2016 12:13    EKG:  Orders placed or performed during the hospital encounter of 09/21/16  . ED EKG 12-Lead  . ED EKG 12-Lead  . EKG 12-Lead  . EKG 12-Lead    ASSESSMENT AND PLAN:  Active Problems:   Sepsis (HCC)  #1.  Gram-negative rod sepsis, continue Zosyn, follow culture results, white blood cell count has improved, as well as fever curve #2. Urinary tract infection due to gram-negative rods, ID is pending, continue antibiotic therapy, follow results, #3. Acute renal insufficiency, improved with IV fluid administration, continue IV fluids, get renal ultrasound #4 demand ischemia, follow troponin tomorrow morning, get echo #5. Lactate acidosis, continue IV fluids, check lactic acid level #6. Leukocytosis, improving #7. Dementia, getting palliative care involved for further recommendations and follow-up, since patient's family is interested in hospice follow-up  at home  Management plans discussed with the patient, family and they are in agreement.   DRUG ALLERGIES: No Known Allergies  CODE STATUS:     Code Status Orders        Start     Ordered   09/21/16 1340  Do not attempt resuscitation (DNR)  Continuous    Question Answer Comment  In the event of cardiac or respiratory ARREST Do not call a "code blue"   In the event of cardiac or respiratory ARREST Do not perform Intubation, CPR, defibrillation or ACLS   In the event of cardiac or respiratory ARREST Use medication by any route, position, wound care, and other measures to relive pain and suffering. May use oxygen, suction and manual treatment of airway obstruction as needed for comfort.      09/21/16 1339    Code Status History    Date Active Date Inactive Code Status Order ID Comments User Context   This patient has a current code status but no historical code status.    Advance Directive Documentation     Most Recent Value  Type of Advance Directive  Healthcare Power of Attorney, Living will  Pre-existing out of facility DNR order (yellow form or pink MOST form)  -  "MOST" Form in Place?  -      TOTAL TIME TAKING CARE OF THIS  PATIENT: 40 minutes.    Katharina CaperVAICKUTE,Roald Lukacs M.D on 09/22/2016 at 1:30 PM  Between 7am to 6pm - Pager -  [phone]  After 6pm go to [email or web address] - password [email or web address] [facility]  [facility]  Office  [phone]  CC: Primary care physician; Maple HudsonGilbert, Richard L Jr., MD

## 2016-09-22 NOTE — Plan of Care (Signed)
Dr. Text to inform of Critical lab: Lactic Acid 4.6.

## 2016-09-22 NOTE — Plan of Care (Signed)
Problem: Education: Goal: Knowledge of [facility] Education information/materials will improve Outcome: Progressing VSS, free of falls during shift.  Denies pain.  Incontinent of B/B multiple times during shift.  Impulsive at times.  No needs overnight.  Low bed, bed in low position.  Call bell within reach, WCTM.

## 2016-09-22 NOTE — Progress Notes (Signed)
PHARMACY NOTE -  ANTIBIOTIC RENAL DOSE ADJUSTMENT   Patient has been initiated on meropenem for E coli bacteremia and UTI. SCr 1.46, estimated CrCl 27.8 ml/min Patient is in ARF with improved SCr. However, patient has E coli bacteremia and GNR in urine which is the likely source. Will increase meropenem dosing to 1 g iv q 12 hours due to severity of infection.   Luisa HartScott Giordana Weinheimer, PharmD Clinical Pharmacist

## 2016-09-22 NOTE — Progress Notes (Signed)
[facility] for [facility] to [facility] Croak[facility] Holland following a Palliative Medicine consult. Patient was admitted from home on 6/25 for treatment of sepsis.  No discharge date at this time. Referral made aware. Thank you. Dayna BarkerKaren Pattison RN, BSN, [facility]CHPN [facility] [name], [facility] [phone] c

## 2016-09-22 NOTE — Progress Notes (Signed)
PHARMACY - PHYSICIAN COMMUNICATION CRITICAL VALUE ALERT - BLOOD CULTURE IDENTIFICATION (BCID)  Results for orders placed or performed during the hospital encounter of 09/21/16  Blood Culture ID Panel (Reflexed) (Collected: 09/21/2016 10:49 AM)  Result Value Ref Range   Enterococcus species NOT DETECTED NOT DETECTED   Listeria monocytogenes NOT DETECTED NOT DETECTED   Staphylococcus species NOT DETECTED NOT DETECTED   Staphylococcus aureus NOT DETECTED NOT DETECTED   Streptococcus species NOT DETECTED NOT DETECTED   Streptococcus agalactiae NOT DETECTED NOT DETECTED   Streptococcus pneumoniae NOT DETECTED NOT DETECTED   Streptococcus pyogenes NOT DETECTED NOT DETECTED   Acinetobacter baumannii NOT DETECTED NOT DETECTED   Enterobacteriaceae species DETECTED (A) NOT DETECTED   Enterobacter cloacae complex NOT DETECTED NOT DETECTED   Escherichia coli DETECTED (A) NOT DETECTED   Klebsiella oxytoca NOT DETECTED NOT DETECTED   Klebsiella pneumoniae NOT DETECTED NOT DETECTED   Proteus species NOT DETECTED NOT DETECTED   Serratia marcescens NOT DETECTED NOT DETECTED   Carbapenem resistance NOT DETECTED NOT DETECTED   Haemophilus influenzae NOT DETECTED NOT DETECTED   Neisseria meningitidis NOT DETECTED NOT DETECTED   Pseudomonas aeruginosa NOT DETECTED NOT DETECTED   Candida albicans NOT DETECTED NOT DETECTED   Candida glabrata NOT DETECTED NOT DETECTED   Candida krusei NOT DETECTED NOT DETECTED   Candida parapsilosis NOT DETECTED NOT DETECTED   Candida tropicalis NOT DETECTED NOT DETECTED    Name of physician (or Provider) Contacted: Pyreddy  Changes to prescribed antibiotics required: Zosyn >> meropenem  Javier Gell S 09/22/2016  5:08 AM

## 2016-09-22 NOTE — Consult Note (Signed)
Consultation Note Date: 09/22/2016   Patient Name: Larry Davidson  DOB: [birth year]  MRN: [id number]  Age / Sex: 83 y.o., male  PCP: Jerrol Banana., MD Referring Physician: Theodoro Grist, MD  Reason for Consultation: Establishing goals of care and Hospice Evaluation  HPI/Patient Profile: 83 y.o. male  with past medical history of dementia, arthritis, hypertension, stroke admitted on 09/21/2016 with sepsis r/t UTI.   Clinical Assessment and Goals of Care: I met today at Larry Davidson's bedside along with son/HCPOA, Larry Davidson. Larry Davidson tells me that his father is much improved today as he is alert and sitting up in bed sipping a cup of coffee. Larry Davidson says he is much more alert. Larry Davidson has inquires about hospice assistance at home. Goal is to keep Larry Davidson at home as long as possible but main caregiver is his wife who is elderly herself. Larry Davidson says that he travels for work and not always available for this reason.   Upon further discussion Larry Davidson says that his father had decent appetite, feeds self, ambulates, continent and goes to bathroom by himself. He has been incontinent during hospital stay and we will see how he works with PT. He also ate a good breakfast this morning. Given Larry Davidson high functional status I explained to Larry Davidson that it doesn't appear that he is hospice appropriate at this time. We did agree to have outpatient palliative follow up to help Larry Davidson know when his father may benefit from hospice services. I shared MOST form and Hard Choices booklet. DNR is confirmed with Larry Davidson and is consistent with patient's Advance Directive. Emotional support provided.   Primary Decision Maker HCPOA son Larry Davidson    SUMMARY OF RECOMMENDATIONS   - DNR confirmed - Son/HCPOA desires hospice support when appropriate - Return home when medically optimized with outpatient palliative services  Code  Status/Advance Care Planning:  DNR   Symptom Management:   No current symptoms to manage  Palliative Prophylaxis:   Delirium Protocol, Frequent Pain Assessment, Oral Care and Turn Reposition  Additional Recommendations (Limitations, Scope, Preferences):  Avoid Hospitalization  Psycho-social/Spiritual:   Desire for further Chaplaincy support:yes  Additional Recommendations: Caregiving  Support/Resources and Education on Hospice  Prognosis:   Unable to determine  Discharge Planning: Home with Palliative Services      Primary Diagnoses: Present on Admission: . Sepsis ([place])   I have reviewed the medical record, interviewed the patient and family, and examined the patient. The following aspects are pertinent.  Past Medical History:  Diagnosis Date  . Arthritis   . Dementia   . Hypertension   . Stroke [facility])    Social History   Social History  . Marital status: Married    Spouse name: N/A  . Number of children: N/A  . Years of education: N/A   Social History Main Topics  . Smoking status: Former Smoker    Quit date: 03/30/1951  . Smokeless tobacco: Never Used  . Alcohol use No  . Drug use: No  . Sexual activity: Not Asked  Other Topics Concern  . None   Social History Narrative  . None   Family History  Problem Relation Age of Onset  . Prostate cancer Neg Hx   . Bladder Cancer Neg Hx   . Kidney cancer Neg Hx    Scheduled Meds: . amLODipine  5 mg Oral QHS  . budesonide (PULMICORT) nebulizer solution  0.25 mg Nebulization BID  . clopidogrel  75 mg Oral Daily  . donepezil  10 mg Oral Daily  . finasteride  5 mg Oral Daily  . ipratropium-albuterol  3 mL Nebulization Q4H  . mirtazapine  30 mg Oral QHS  . tamsulosin  0.4 mg Oral Daily   Continuous Infusions: . meropenem    . vancomycin 1,000 mg (09/22/16 0627)   PRN Meds:.acetaminophen **OR** acetaminophen, bisacodyl, ibuprofen, ondansetron **OR** ondansetron (ZOFRAN) IV, senna-docusate No  Known Allergies Review of Systems  Unable to perform ROS: Dementia    Physical Exam  Constitutional: He appears well-developed.  HENT:  Head: Normocephalic and atraumatic.  Cardiovascular: Normal rate and regular rhythm.   Pulmonary/Chest: Effort normal. No accessory muscle usage. No tachypnea. No respiratory distress.  Abdominal: Normal appearance.  Neurological: He is alert. He is disoriented.  Nursing note and vitals reviewed.   Vital Signs: BP (!) 131/56 (BP Location: Right Arm)   Pulse 85   Temp 98.6 F (37 C) (Oral)   Resp (!) 21   Ht _0  (1.702 m)   Wt 58.5 kg (129 lb)   SpO2 96%   BMI 20.20 kg/m  Pain Assessment: No/denies pain       SpO2: SpO2: 96 % O2 Device:SpO2: 96 % O2 Flow Rate: .   IO: Intake/output summary:  Intake/Output Summary (Last 24 hours) at 09/22/16 1114 Last data filed at 09/22/16 4132  Gross per 24 hour  Intake             2789 ml  Output                0 ml  Net             2789 ml    LBM: Last BM Date: 09/20/16 Baseline Weight: Weight: 55.8 kg (123 lb) Most recent weight: Weight: 58.5 kg (129 lb)     Palliative Assessment/Data: 70%   Flowsheet Rows     Most Recent Value  Intake Tab  Referral Department  Hospitalist  Unit at Time of Referral  Oncology Unit  Palliative Care Primary Diagnosis  Sepsis/Infectious Disease  Date Notified  09/22/16  Palliative Care Type  New Palliative care  Reason for referral  Clarify Goals of Care, Counsel Regarding Hospice  Date of Admission  09/21/16  # of days IP prior to Palliative referral  1  Clinical Assessment  Psychosocial & Spiritual Assessment  Palliative Care Outcomes      Time Total: 57mn  Greater than 50%  of this time was spent counseling and coordinating care related to the above assessment and plan.  Signed by: AVinie Sill NP Palliative Medicine Team Pager # [phone](M-F 8a-5p) Team Phone # [phone](Nights/Weekends)

## 2016-09-22 NOTE — Telephone Encounter (Signed)
[facility]  They are faxing a palliative order to you to be completed.  She asked if when you recd it you could complete it and send it back asap.  ThanksTeri

## 2016-09-22 NOTE — Telephone Encounter (Signed)
Please review

## 2016-09-22 NOTE — Evaluation (Signed)
Clinical/Bedside Swallow Evaluation Patient Details  Name: Larry Davidson MRN: [id number] Date of Birth: [birth year]  Today's Date: 09/22/2016 Time: SLP Start Time (ACUTE ONLY): 0830 SLP Stop Time (ACUTE ONLY): 0930 SLP Time Calculation (min) (ACUTE ONLY): 60 min  Past Medical History:  Past Medical History:  Diagnosis Date  . Arthritis   . Dementia   . Hypertension   . Stroke [facility])    Past Surgical History: History reviewed. No pertinent surgical history. HPI:  Pt  is a 83 y.o. male w/ h/o moderate Dementia, HTN, CVA who comes to the emergency department via EMS for fever and altered mental status for the last day or so. According to the patient's wife the patient has had roughly 2 days of cough and some shortness of breath. He has a long-standing history of multiple urinary tract infections. This morning the patient tried to get up to walk to the bathroom and he was too tired and laid down on the floor. Patient's wife assisted him back to bed and eventually called 911 not for fever but for altered mental status. When EMS arrived they noted his temperature was 102.7 axillary and he was tachycardic. They called a code sepsis. Pt is currently awake, verbally responsive w/ confusion noted during engagement but able to follow through w/ instruction/tasks w/ min cues. Ongoing mild throat clearing noted during conversation - Son arrived during session and stated this was pt's baseline and "has done that for years" referring to the throat clearing.    Assessment / Plan / Recommendation Clinical Impression  Pt appears at reduced risk for aspiration when following general aspiration precautions; no gross oropharyngeal phase dysphagia noted at this evaluation today. Son present and endorsed pt's mild, brief throat clearing as something "he has done for years". Pt was given po trials of thin liquids, purees, and softened solids w/ no immediate, overt s/s of aspiration noted. Pt did exhibit the mild,  brief throat clearing inconsistently w/ trials, but this did not increased in intensity or frequency. During the oral phase, pt appeared to adequately tolerate trials given w/ min increased mastication time/effort w/ increased textured food. Pt's Son agreed pt could benefit from softer foods; cut well. Pt was able to feed himself w/ setup assistance and required verbal cues to attend to task at times. Pt does have a baseline of Dementia and would benefit from some monitoring at meals during current illness. Recommend a mech soft diet w/ thin liquids; general aspiration precautions. Monitor for any increase throat clearing and/or coughing w/ oral intake. Pills in Puree IF needed for easier swallowing at this time.  SLP Visit Diagnosis: Dysphagia, oropharyngeal phase (R13.12)    Aspiration Risk   (reduced w/ general aspiration precautions)    Diet Recommendation  Dysphagia level 3(mech soft) w/ Thin liquids VIA CUP - no straw at this time; general aspiration precautions; feeding support at meals  Medication Administration: Whole meds with puree (or crushed if needed for easier swallowing currently)    Other  Recommendations Recommended Consults:  (Dietician f/u) Oral Care Recommendations: Oral care BID;Staff/trained caregiver to provide oral care   Follow up Recommendations None      Frequency and Duration min 2x/week  1 week       Prognosis Prognosis for Safe Diet Advancement: Fair (-Good) Barriers to Reach Goals: Cognitive deficits      Swallow Study   General Date of Onset: 09/21/16 HPI: Pt  is a 83 y.o. male w/ h/o moderate Dementia, HTN, CVA who  comes to the emergency department via EMS for fever and altered mental status for the last day or so. According to the patient's wife the patient has had roughly 2 days of cough and some shortness of breath. He has a long-standing history of multiple urinary tract infections. This morning the patient tried to get up to walk to the bathroom and  he was too tired and laid down on the floor. Patient's wife assisted him back to bed and eventually called 911 not for fever but for altered mental status. When EMS arrived they noted his temperature was 102.7 axillary and he was tachycardic. They called a code sepsis. Pt is currently awake, verbally responsive w/ confusion noted during engagement but able to follow through w/ instruction/tasks w/ min cues. Ongoing mild throat clearing noted during conversation - Son arrived during session and stated this was pt's baseline and "has done that for years" referring to the throat clearing.  Type of Study: Bedside Swallow Evaluation Previous Swallow Assessment: none Diet Prior to this Study: Dysphagia 3 (soft);Thin liquids (cut foods at home) Temperature Spikes Noted: Yes (wbc elevated) Respiratory Status: Room air History of Recent Intubation: No Behavior/Cognition: Alert;Cooperative;Pleasant mood;Confused;Distractible;Requires cueing Oral Cavity Assessment: Dry (bony ridges) Oral Care Completed by SLP: Yes Oral Cavity - Dentition: Dentures, top;Dentures, bottom (edentulous at this eval initially) Vision: Functional for self-feeding Self-Feeding Abilities: Able to feed self;Needs assist;Needs set up Patient Positioning: Upright in bed Baseline Vocal Quality: Normal Volitional Cough: Strong Volitional Swallow: Able to elicit    Oral/Motor/Sensory Function Overall Oral Motor/Sensory Function: Within functional limits (grossly)   Ice Chips Ice chips: Within functional limits Presentation: Spoon (fed; 4 trials)   Thin Liquid Thin Liquid: Within functional limits (for pt - appeared at his baseline per Son) Presentation: Cup;Self Fed (~4-5 ozs total) Other Comments: pt did exhibit the mild, brief throat clearing inconsistently w/ trials but this did not increased in intensity or frequency    Nectar Thick Nectar Thick Liquid: Not tested   Honey Thick Honey Thick Liquid: Not tested   Puree Puree:  Within functional limits (at his baseline) Presentation: Spoon;Self Fed (assisted; 4 ozs) Other Comments: pt did exhibit the mild, brief throat clearing inconsistently w/ trials but this did not increased in intensity or frequency   Solid   GO   Solid: Impaired Presentation: Spoon (fed; 2 trials softened, briken down) Oral Phase Impairments: Impaired mastication (min increased time/effort) Oral Phase Functional Implications: Impaired mastication Pharyngeal Phase Impairments:  (none) Other Comments: pt did exhibit the mild, brief throat clearing inconsistently w/ trials but this did not increased in intensity or frequency         Jerilynn SomKatherine Watson, MS, CCC-SLP Watson,Katherine 09/22/2016,1:48 PM

## 2016-09-22 NOTE — Telephone Encounter (Signed)
Please see below-aa 

## 2016-09-23 ENCOUNTER — Inpatient Hospital Stay: Payer: PPO

## 2016-09-23 ENCOUNTER — Inpatient Hospital Stay
Admit: 2016-09-23 | Discharge: 2016-09-23 | Disposition: A | Discharge status: Home / Self Care | Payer: PPO | Attending: Internal Medicine | Admitting: Internal Medicine

## 2016-09-23 DIAGNOSIS — R339 Retention of urine, unspecified: Secondary | ICD-10-CM

## 2016-09-23 DIAGNOSIS — A4151 Sepsis due to Escherichia coli [E. coli]: Secondary | ICD-10-CM

## 2016-09-23 DIAGNOSIS — Z515 Encounter for palliative care: Secondary | ICD-10-CM

## 2016-09-23 DIAGNOSIS — Z7189 Other specified counseling: Secondary | ICD-10-CM

## 2016-09-23 LAB — CBC
HCT: 37.2 % — ABNORMAL LOW (ref 40.0–52.0)
Hemoglobin: 12.6 g/dL — ABNORMAL LOW (ref 13.0–18.0)
MCH: 30.7 pg (ref 26.0–34.0)
MCHC: 34 g/dL (ref 32.0–36.0)
MCV: 90.5 fL (ref 80.0–100.0)
PLATELETS: 203 10*3/uL (ref 150–440)
RBC: 4.11 MIL/uL — AB (ref 4.40–5.90)
RDW: 15.3 % — AB (ref 11.5–14.5)
WBC: 8.3 10*3/uL (ref 3.8–10.6)

## 2016-09-23 LAB — URINE CULTURE

## 2016-09-23 LAB — BLOOD GAS, ARTERIAL
ACID-BASE EXCESS: 4.1 mmol/L — AB (ref 0.0–2.0)
Bicarbonate: 27.6 mmol/L (ref 20.0–28.0)
FIO2: 0.21
O2 SAT: 94.8 %
PATIENT TEMPERATURE: 37
PCO2 ART: 37 mmHg (ref 32.0–48.0)
PO2 ART: 69 mmHg — AB (ref 83.0–108.0)
pH, Arterial: 7.48 — ABNORMAL HIGH (ref 7.350–7.450)

## 2016-09-23 LAB — COMPREHENSIVE METABOLIC PANEL
ALT: 18 U/L (ref 17–63)
AST: 32 U/L (ref 15–41)
Albumin: 2.6 g/dL — ABNORMAL LOW (ref 3.5–5.0)
Alkaline Phosphatase: 44 U/L (ref 38–126)
Anion gap: 7 (ref 5–15)
BILIRUBIN TOTAL: 0.5 mg/dL (ref 0.3–1.2)
BUN: 21 mg/dL — AB (ref 6–20)
CO2: 25 mmol/L (ref 22–32)
CREATININE: 1.21 mg/dL (ref 0.61–1.24)
Calcium: 9.2 mg/dL (ref 8.9–10.3)
Chloride: 110 mmol/L (ref 101–111)
GFR, EST AFRICAN AMERICAN: 59 mL/min — AB (ref >60)
GFR, EST NON AFRICAN AMERICAN: 51 mL/min — AB (ref >60)
Glucose, Bld: 182 mg/dL — ABNORMAL HIGH (ref 65–99)
Potassium: 3 mmol/L — ABNORMAL LOW (ref 3.5–5.1)
Sodium: 142 mmol/L (ref 135–145)
TOTAL PROTEIN: 5.8 g/dL — AB (ref 6.5–8.1)

## 2016-09-23 LAB — GLUCOSE, CAPILLARY: GLUCOSE-CAPILLARY: 152 mg/dL — AB (ref 65–99)

## 2016-09-23 LAB — LACTIC ACID, PLASMA
LACTIC ACID, VENOUS: 1.9 mmol/L (ref 0.5–1.9)
Lactic Acid, Venous: 2.5 mmol/L (ref 0.5–1.9)

## 2016-09-23 LAB — ECHOCARDIOGRAM COMPLETE
HEIGHTINCHES: 67 in
WEIGHTICAEL: 2160 [oz_av]

## 2016-09-23 LAB — TROPONIN I
TROPONIN I: 0.04 ng/mL — AB (ref <0.03)
TROPONIN I: 0.03 ng/mL — AB (ref <0.03)

## 2016-09-23 MED ORDER — SODIUM CHLORIDE 0.9 % IV SOLN
INTRAVENOUS | Status: DC
  Administered 2016-09-23: 08:00:00 via INTRAVENOUS

## 2016-09-23 MED ORDER — LORAZEPAM 2 MG/ML IJ SOLN
1.0000 mg | INTRAMUSCULAR | Status: DC | PRN
  Administered 2016-09-23: 1 mg via INTRAVENOUS
  Filled 2016-09-23 (×2): qty 0.5

## 2016-09-23 MED ORDER — IPRATROPIUM-ALBUTEROL 0.5-2.5 (3) MG/3ML IN SOLN
3.0000 mL | Freq: Four times a day (QID) | RESPIRATORY_TRACT | Status: DC
  Administered 2016-09-23 (×2): 3 mL via RESPIRATORY_TRACT
  Filled 2016-09-23 (×4): qty 3

## 2016-09-23 MED ORDER — LORAZEPAM 2 MG/ML IJ SOLN: 1.0000 mg | INTRAMUSCULAR | Status: DC

## 2016-09-23 NOTE — Progress Notes (Signed)
PT is recommending no follow up. Please reconsult if future social work needs arise. CSW signing off.   Alucard Fearnow, LCSW [phone]  

## 2016-09-23 NOTE — Progress Notes (Signed)
[facility] - [place] at [facility]   PATIENT NAME: Larry Davidson    MR#:  [id number]  DATE OF BIRTH:  Aug 01, 1923  SUBJECTIVE:  CHIEF COMPLAINT:   Chief Complaint  Patient presents with  . Altered Mental Status   Review of Systems  Unable to perform ROS: Dementia  Gastrointestinal: Positive for abdominal pain.   The patient is a 83 year old Caucasian male who is with history of dementia, BPH, Foley catheter removal about a week ago, who presents to the hospital with fever, weakness, fall. On arrival he was noted to be tachycardic with heart rate around 120, febrile  To about 102. Labs revealed acute renal insufficiency with creatinine 1.65, hypokalemia, mild elevation of troponin, lactic acidosis, leukocytosis, pyuria. Hospitalist where consulted for admission. Patient was admitted and initiated on Zosyn. His labs revealed improvement, including improved. Her white blood cell count. Fever have subsided. Blood cultures revealed gram-negative rods, PCR was positive for Escherichia coli, urine culture  positive for Escherichia coli, pansensitive. Patient complains of lower abdominal pain on palpation, bladder scan revealed more than 500 cc of urine, Foley catheter is placed, drained more than 500 cc..  Patient became more somnolent later in the day,presently unresponsive per nursing staff, developedfine twitching of her eyelids, concerning for seizure activity.  VITAL SIGNS: Blood pressure 135/70, pulse (!) 116, temperature 98 F (36.7 C), temperature source Oral, resp. rate 20, height 5\' 7"  (1.702 m), weight 61.2 kg (135 lb), SpO2 95 %.  PHYSICAL EXAMINATION:   GENERAL:  83 y.o.-year-old patient layingon the bed with no acute distress, mumbles by himself, not able to express himself.  EYES: Pupils equal, round, reactive to light and accommodation. No scleral icterus. Extraocular muscles intact.  HEENT: Head atraumatic, normocephalic. Oropharynx and  nasopharynx clear.  NECK:  Supple, no jugular venous distention. No thyroid enlargement, no tenderness.  LUNGS: Diminished breath sounds bilaterally, no wheezing, rales,rhonchi or crepitation. No use of accessory muscles of respiration.  CARDIOVASCULAR: S1, S2 normal. No murmurs, rubs, or gallops.  ABDOMEN: Soft, tenderness suprapubically, but no rebound or guarding was noted, nondistended. Bowel sounds present. No organomegaly or mass.  EXTREMITIES: No pedal edema, cyanosis, or clubbing.  NEUROLOGIC: Cranial nerves II through XII aregrossly intact. Muscle moves all extremities. Sensation unable to assess as patient is demented. Gait not checked.  PSYCHIATRIC: The patient is alert unable to assess orientation.  SKIN: No obvious rash, lesion, or ulcer.   ORDERS/RESULTS REVIEWED:   CBC  Recent Labs Lab 09/21/16 1049 09/22/16 0444  WBC 16.2* 14.4*  HGB 12.7* 12.3*  HCT 38.1* 36.1*  PLT 185 165  MCV 90.4 89.9  MCH 30.1 30.6  MCHC 33.3 34.1  RDW 14.8* 15.3*  LYMPHSABS 0.4*  --   MONOABS 1.0  --   EOSABS 0.0  --   BASOSABS 0.0  --    ------------------------------------------------------------------------------------------------------------------  Chemistries   Recent Labs Lab 09/21/16 1049 09/22/16 0444  NA 139 140  K 3.4* 4.1  CL 106 111  CO2 24 22  GLUCOSE 120* 86  BUN 29* 31*  CREATININE 1.65* 1.46*  CALCIUM 9.6 9.5  AST 26  --   ALT 13*  --   ALKPHOS 55  --   BILITOT 0.9  --    ------------------------------------------------------------------------------------------------------------------ estimated creatinine clearance is 29.1 mL/min (A) (by C-G formula based on SCr of 1.46 mg/dL (H)). ------------------------------------------------------------------------------------------------------------------ No results for input(s): TSH, T4TOTAL, T3FREE, THYROIDAB in the last 72 hours.  Invalid input(s): FREET3  Cardiac Enzymes  Recent Labs Lab 09/21/16 1810  09/21/16 2344 09/22/16 1404  TROPONINI 0.05* 0.07* 0.07*   ------------------------------------------------------------------------------------------------------------------ Invalid input(s): POCBNP ---------------------------------------------------------------------------------------------------------------  RADIOLOGY: No results found.  EKG:  Orders placed or performed during the hospital encounter of 09/21/16  . ED EKG 12-Lead  . ED EKG 12-Lead  . EKG 12-Lead  . EKG 12-Lead    ASSESSMENT AND PLAN:  Active Problems:   Sepsis (HCC)   Goals of care, counseling/discussion   Palliative care encounter  #1. Escherichia coli sepsis, continue Zosyn, follow culture results, white blood cell count has improved, as well as fever curve, follow up CBC in the morning #2. Urinary tract infection due to Escherichia coli, continue antibiotic therapy, change to Keflex when Blood cultures are known #3. Acute renal insufficiency, improved with IV fluid administration, continue IV fluids, get renal ultrasound, patient developed acute urinary retention, status post Foley catheter replacement today #4 demand ischemia, repeat troponin,  Echo showed normal ejection fraction #5. Lactic  acidosis, continue IV fluids, check lactic acid level #6. Leukocytosis, repeat labs #7. Dementia, appreciate palliative care input,patient's family is interested in the rehabilitation, but it was not felt that patient is a candidate for hospice, unless continues to decline #8. Unresponsiveness, etiology is unclear, ABGs revealed hypoxia, repeating labs, head CT,electroencephalogram  Management plans discussed with the patient, family and they are in agreement.   DRUG ALLERGIES: No Known Allergies  CODE STATUS:     Code Status Orders        Start     Ordered   09/21/16 1340  Do not attempt resuscitation (DNR)  Continuous    Question Answer Comment  In the event of cardiac or respiratory ARREST Do not call a  "code blue"   In the event of cardiac or respiratory ARREST Do not perform Intubation, CPR, defibrillation or ACLS   In the event of cardiac or respiratory ARREST Use medication by any route, position, wound care, and other measures to relive pain and suffering. May use oxygen, suction and manual treatment of airway obstruction as needed for comfort.      09/21/16 1339    Code Status History    Date Active Date Inactive Code Status Order ID Comments User Context   This patient has a current code status but no historical code status.    Advance Directive Documentation     Most Recent Value  Type of Advance Directive  Healthcare Power of Attorney, Living will  Pre-existing out of facility DNR order (yellow form or pink MOST form)  -  "MOST" Form in Place?  -      TOTAL TIME TAKING CARE OF THIS PATIENT: 40 minutes.    Katharina CaperVAICKUTE,Ravyn Nikkel M.D on 09/23/2016 at 3:51 PM  Between 7am to 6pm - Pager - [phone]  After 6pm go to [email or web address] - password [email or web address] [email or web address]  [facility]  Office  [phone]  CC: Primary care physician; Maple HudsonGilbert, Richard L Jr., MD

## 2016-09-23 NOTE — Progress Notes (Signed)
Called by PT to assess patient as he was very lethargic. Pt somewhat responsive to pain only. Assessed and noticed pt very lethargic and seizure like activity was noted. VSS, BG 152. MD notified, received orders. Pt transported to CT and US. Otilio JeffersonMadelyn S Fenton, RN

## 2016-09-23 NOTE — Progress Notes (Signed)
Daily Progress Note   Patient Name: Larry Davidson       Date: 09/23/2016 DOB: 1923-04-17  Age: 83 y.o. MRN#: [id number] Attending Physician: Katharina Caper, MD Primary Care Physician: Maple Hudson., MD Admit Date: 09/21/2016  Reason for Consultation/Follow-up: Establishing goals of care and Hospice Evaluation  Subjective: Larry Davidson is more lethargic today. Just had catheter placed.   Length of Stay: 2  Current Medications: Scheduled Meds:  . amLODipine  5 mg Oral QHS  . budesonide (PULMICORT) nebulizer solution  0.25 mg Nebulization BID  . clopidogrel  75 mg Oral Daily  . donepezil  10 mg Oral Daily  . finasteride  5 mg Oral Daily  . ipratropium-albuterol  3 mL Nebulization Q6H  . mirtazapine  30 mg Oral QHS  . tamsulosin  0.4 mg Oral Daily    Continuous Infusions: . sodium chloride 75 mL/hr at 09/23/16 0736  . meropenem (MERREM) IV 1 g (09/23/16 1008)    PRN Meds: acetaminophen **OR** acetaminophen, bisacodyl, ibuprofen, ondansetron **OR** ondansetron (ZOFRAN) IV, senna-docusate  Physical Exam      Constitutional: He appears well-developed.  HENT:  Head: Normocephalic and atraumatic.  Cardiovascular: Normal rate and regular rhythm.   Pulmonary/Chest: Effort normal. No accessory muscle usage. No tachypnea. No respiratory distress.  Abdominal: Normal appearance.  Neurological: He is alert. He is disoriented. Lethargic but arousable.  Nursing note and vitals reviewed.  Vital Signs: BP (!) 149/83 (BP Location: Left Arm)   Pulse (!) 110   Temp 97.7 F (36.5 C) (Oral)   Resp 18   Ht 5\' 7"  (1.702 m)   Wt 61.2 kg (135 lb)   SpO2 98%   BMI 21.14 kg/m  SpO2: SpO2: 98 % O2 Device: O2 Device: Not Delivered O2 Flow Rate:    Intake/output summary:    Intake/Output Summary (Last 24 hours) at 09/23/16 1113 Last data filed at 09/23/16 0858  Gross per 24 hour  Intake              410 ml  Output              800 ml  Net             -390 ml   LBM: Last BM Date: 09/22/16 Baseline Weight: Weight: 55.8 kg (123 lb) Most recent  weight: Weight: 61.2 kg (135 lb)       Palliative Assessment/Data: 50%   Flowsheet Rows     Most Recent Value  Intake Tab  Referral Department  Hospitalist  Unit at Time of Referral  Oncology Unit  Palliative Care Primary Diagnosis  Sepsis/Infectious Disease  Date Notified  09/22/16  Palliative Care Type  New Palliative care  Reason for referral  Clarify Goals of Care, Counsel Regarding Hospice  Date of Admission  09/21/16  # of days IP prior to Palliative referral  1  Clinical Assessment  Psychosocial & Spiritual Assessment  Palliative Care Outcomes      Patient Active Problem List   Diagnosis Date Noted  . Goals of care, counseling/discussion   . Palliative care encounter   . Sepsis (HCC) 09/21/2016  . Hydrocele, right 09/01/2016  . Urinary retention 08/31/2016  . Hypertension 11/06/2015  . Dementia 11/06/2015  . Hyperlipidemia 11/06/2015    Palliative Care Assessment & Plan   HPI: 83 y.o. male  with past medical history of dementia, arthritis, hypertension, stroke admitted on 09/21/2016 with sepsis r/t UTI. E. coli showing in blood cultures.   Assessment: I spoke again with son, Larry Davidson. Larry Davidson is concerned that his father has declined since we spoke yesterday. He is still hopeful for improvement. Very concerned about his mother's ability to care for his father at home (his work requires him to travel and he is not always available to help). He expresses interest in pursuing rehab. We discussed that we will have PT reevaluate when closer to discharge but that insurance approval depends on recommendations from PT. We also discussed that we will consider hospice if he continues to decline and/or if  this urinary retention and infection becomes a recurrent cycle. Need more time to see how he will do. I will continue to follow and support.   Recommendations/Plan:  Pain from urinary retention relieved with catheter placement. Continue Proscar and Flomax. Consider urology input if continued issues for further assessment/recommendations. Has seen Dr. Berneice HeinrichManny 09/01/16 and Dr. Sherron MondayMacDiarmid 09/14/16.   Goals of Care and Additional Recommendations:  Limitations on Scope of Treatment: No Artificial Feeding and No Tracheostomy, no aggressive treatments desired  Code Status:  DNR  Prognosis:   Unable to determine  Discharge Planning:  To Be Determined   Thank you for allowing the Palliative Medicine Team to assist in the care of this patient.   Total Time 30min Prolonged Time Billed  no       Greater than 50%  of this time was spent counseling and coordinating care related to the above assessment and plan.  Yong ChannelAlicia Kiylah Loyer, NP Palliative Medicine Team Pager # [phone] (M-F 8a-5p) Team Phone # [phone] (Nights/Weekends)

## 2016-09-23 NOTE — Progress Notes (Signed)
PT Cancellation Note  Patient Details Name: Winona LegatoHarold E Polhemus MRN: [id number] DOB: 1923-07-16   Cancelled Treatment:    Reason Eval/Treat Not Completed: Other (comment): Chart reviewed. Treatment attempted this afternoon. Pt very lethargic, and PT/pt's wife were unable to arouse him. Pt opened his eyes 3x, but was unable to remain alert for more than a few seconds. HR: 115bpm. SAO2: 93% on room air. RN notified of pt's vitals and arousal level. Treatment deferred to tomorrow time permitting.   Mabeline CarasMichelle C Abree Romick, PT, SPT  SekiuMichelle Ricci Dirocco 09/23/2016, 4:14 PM

## 2016-09-23 NOTE — Plan of Care (Signed)
Problem: SLP Dysphagia Goals Goal: Misc Dysphagia Goal Pt will safely tolerate po diet of least restrictive consistency w/ no overt s/s of aspiration noted by Staff/pt/family x3 sessions.    

## 2016-09-23 NOTE — Progress Notes (Signed)
*  PRELIMINARY RESULTS* Echocardiogram 2D Echocardiogram has been performed.  Cristela BlueHege, Kamdon Reisig 09/23/2016, 10:07 AM

## 2016-09-23 NOTE — Progress Notes (Signed)
Speech Language Pathology Treatment: Dysphagia  Patient Details Name: Larry Davidson MRN: [id number] DOB: 1924-01-28 Today's Date: 09/23/2016 Time: [id number] SLP Time Calculation (min) (ACUTE ONLY): 25 min  Assessment / Plan / Recommendation Clinical Impression  Pt seen for toleration of current diet; Wife present at session. Pt appeared agitated, moreso than at eval yesterday w/ gown needing readjustment. Wife stated this had been so earlier this morning as well. She stated her Son had brought pt a breakfast biscuit from [employer] which pt ate w/ no problems that she noted.  Pt appeared to adequately consume few trials of thin liquids but quickly stated he did not want anything else nor did he want any lunch/food to eat. Wife requested help w/ ordering the lunch meal so this was done as she hoped he "would change his mind later". No overt s/s of aspiration were noted w/ few trials nor w/ oral intake earlier w/ family or when swallowing medicine w/ NSG. Pt remains at reduced risk for aspiration when following general aspiration precautions but d/t his declined Cognitive status/baseline Dementia, he does require assistance and monitoring during any oral intake; and aspiration precautions. ST services will continue to f/u w/ pt's status and toleration of diet while admitted. NSG updated. Wife/NSG agreed.    HPI HPI: Pt  is a 83 y.o. male w/ h/o moderate Dementia, HTN, CVA who comes to the emergency department via EMS for fever and altered mental status for the last day or so. According to the patient's wife the patient has had roughly 2 days of cough and some shortness of breath. He has a long-standing history of multiple urinary tract infections. This morning the patient tried to get up to walk to the bathroom and he was too tired and laid down on the floor. Patient's wife assisted him back to bed and eventually called 911 not for fever but for altered mental status. When EMS arrived they noted his  temperature was 102.7 axillary and he was tachycardic. They called a code sepsis. Pt currently awake, verbally responsive w/ SLP and Wife who was present. Pt w/ moderate confusion noted during engagement but able to follow through w/ instruction/tasks w/ cues. Wife stated pt's mild throat clearing noted intermittently at rest was pt's baseline and agreed he "has done that for years" referring to the throat clearing. Pt seemed more agitated than at eval yesterday and kept endorsing that he was "not hungry" though Wife wanted to order a lunch meal; SLP assisted w/ that.        SLP Plan  Continue with current plan of care       Recommendations  Diet recommendations: Dysphagia 3 (mechanical soft);Thin liquid (minced meats, well-cut, moistened foods) Liquids provided via: Cup;No straw Medication Administration: Whole meds with puree (but crushed in puree if needed at this time) Supervision: Patient able to self feed;Full supervision/cueing for compensatory strategies Compensations: Minimize environmental distractions;Slow rate;Small sips/bites;Lingual sweep for clearance of pocketing;Multiple dry swallows after each bite/sip;Follow solids with liquid Postural Changes and/or Swallow Maneuvers: Seated upright 90 degrees;Upright 30-60 min after meal                General recommendations:  (Dietician f/u) Oral Care Recommendations: Oral care BID;Staff/trained caregiver to provide oral care Follow up Recommendations: None (TBD) SLP Visit Diagnosis: Dysphagia, oropharyngeal phase (R13.12) Plan: Continue with current plan of care       GO                 Natalia LeatherwoodKatherine  Claudette Laws, MS, CCC-SLP [name] 09/23/2016, 5:09 PM

## 2016-09-24 ENCOUNTER — Inpatient Hospital Stay: Payer: PPO

## 2016-09-24 DIAGNOSIS — T83511A Infection and inflammatory reaction due to indwelling urethral catheter, initial encounter: Secondary | ICD-10-CM

## 2016-09-24 DIAGNOSIS — I248 Other forms of acute ischemic heart disease: Secondary | ICD-10-CM

## 2016-09-24 DIAGNOSIS — D72829 Elevated white blood cell count, unspecified: Secondary | ICD-10-CM

## 2016-09-24 DIAGNOSIS — E872 Acidosis, unspecified: Secondary | ICD-10-CM

## 2016-09-24 DIAGNOSIS — N289 Disorder of kidney and ureter, unspecified: Secondary | ICD-10-CM

## 2016-09-24 DIAGNOSIS — R531 Weakness: Secondary | ICD-10-CM

## 2016-09-24 DIAGNOSIS — R338 Other retention of urine: Secondary | ICD-10-CM

## 2016-09-24 DIAGNOSIS — A4151 Sepsis due to Escherichia coli [E. coli]: Secondary | ICD-10-CM

## 2016-09-24 DIAGNOSIS — R4189 Other symptoms and signs involving cognitive functions and awareness: Secondary | ICD-10-CM

## 2016-09-24 DIAGNOSIS — N390 Urinary tract infection, site not specified: Secondary | ICD-10-CM

## 2016-09-24 DIAGNOSIS — E876 Hypokalemia: Secondary | ICD-10-CM

## 2016-09-24 LAB — CBC
HCT: 39.8 % — ABNORMAL LOW (ref 40.0–52.0)
HEMOGLOBIN: 13.5 g/dL (ref 13.0–18.0)
MCH: 30.6 pg (ref 26.0–34.0)
MCHC: 33.8 g/dL (ref 32.0–36.0)
MCV: 90.5 fL (ref 80.0–100.0)
Platelets: 201 10*3/uL (ref 150–440)
RBC: 4.4 MIL/uL (ref 4.40–5.90)
RDW: 15.5 % — ABNORMAL HIGH (ref 11.5–14.5)
WBC: 9.1 10*3/uL (ref 3.8–10.6)

## 2016-09-24 LAB — CULTURE, BLOOD (ROUTINE X 2)
Special Requests: ADEQUATE
Special Requests: ADEQUATE

## 2016-09-24 LAB — TROPONIN I: TROPONIN I: 0.07 ng/mL — AB (ref <0.03)

## 2016-09-24 MED ORDER — CEPHALEXIN 500 MG PO CAPS
500.0000 mg | ORAL_CAPSULE | Freq: Two times a day (BID) | ORAL | Status: DC
  Administered 2016-09-24 – 2016-09-25 (×3): 500 mg via ORAL
  Filled 2016-09-24 (×3): qty 1

## 2016-09-24 MED ORDER — IPRATROPIUM-ALBUTEROL 0.5-2.5 (3) MG/3ML IN SOLN
3.0000 mL | Freq: Three times a day (TID) | RESPIRATORY_TRACT | Status: DC
  Administered 2016-09-24 – 2016-09-25 (×5): 3 mL via RESPIRATORY_TRACT
  Filled 2016-09-24 (×5): qty 3

## 2016-09-24 MED ORDER — CEPHALEXIN 500 MG PO CAPS: 500.0000 mg | ORAL_CAPSULE | Freq: Two times a day (BID) | ORAL | 0 refills | Status: DC

## 2016-09-24 MED ORDER — IPRATROPIUM-ALBUTEROL 0.5-2.5 (3) MG/3ML IN SOLN: 3.0000 mL | Freq: Four times a day (QID) | RESPIRATORY_TRACT | Status: DC | PRN

## 2016-09-24 MED ORDER — SODIUM CHLORIDE 0.9 % IV SOLN
INTRAVENOUS | Status: DC
  Administered 2016-09-24 – 2016-09-25 (×3): via INTRAVENOUS

## 2016-09-24 MED ORDER — POTASSIUM CHLORIDE CRYS ER 20 MEQ PO TBCR
40.0000 meq | EXTENDED_RELEASE_TABLET | ORAL | Status: AC
  Administered 2016-09-24 (×2): 40 meq via ORAL
  Filled 2016-09-24 (×2): qty 2

## 2016-09-24 NOTE — Progress Notes (Signed)
Chart reviewed. Pt became lethargic, mostly unresponsive yesterday afternoon, concern for seizure. Lethargic this afternoon as well, not able to take breakfast or dinner. Nsg and wife report Pt ate some applesauce with meds without difficulty. Will downgrade diet to dys 1 (puree) for now. Will upgrade tomorrow if more alert. Rec holding tray if Pt is not alert enough to swallow safely.

## 2016-09-24 NOTE — Progress Notes (Addendum)
Daily Progress Note   Patient Name: Larry Davidson       Date: 09/24/2016 DOB: 10/09/1923  Age: 83 y.o. MRN#: [id number] Attending Physician: Katharina Caper, MD Primary Care Physician: Maple Hudson., MD Admit Date: 09/21/2016  Reason for Consultation/Follow-up: Establishing goals of care and Hospice Evaluation  Subjective: Larry Davidson has greatly declined since yesterday and is now minimally responsive.   Length of Stay: 3  Current Medications: Scheduled Meds:  . amLODipine  5 mg Oral QHS  . budesonide (PULMICORT) nebulizer solution  0.25 mg Nebulization BID  . cephALEXin  500 mg Oral Q12H  . clopidogrel  75 mg Oral Daily  . donepezil  10 mg Oral Daily  . finasteride  5 mg Oral Daily  . ipratropium-albuterol  3 mL Nebulization TID  . mirtazapine  30 mg Oral QHS  . potassium chloride  40 mEq Oral Q4H  . tamsulosin  0.4 mg Oral Daily    Continuous Infusions:   PRN Meds: acetaminophen **OR** acetaminophen, bisacodyl, ibuprofen, ipratropium-albuterol, LORazepam, ondansetron **OR** ondansetron (ZOFRAN) IV, senna-docusate  Physical Exam  Constitutional: He appears well-developed.  Thin, frail  Cardiovascular: Normal rate.   Pulmonary/Chest: No accessory muscle usage. No tachypnea. No respiratory distress. He has decreased breath sounds.  Abdominal: Soft. Normal appearance.  Neurological:  Minimally responsive. Arouses for just seconds with much verbal and tactile stimulation.  Nursing note and vitals reviewed.         Vital Signs: BP (!) 145/67 (BP Location: Right Arm)   Pulse 86   Temp (!) 96.7 F (35.9 C) (Axillary)   Resp 20   Ht 5\' 7"  (1.702 m)   Wt 61.2 kg (135 lb)   SpO2 93%   BMI 21.14 kg/m  SpO2: SpO2: 93 % O2 Device: O2 Device: Not  Delivered O2 Flow Rate:    Intake/output summary:   Intake/Output Summary (Last 24 hours) at 09/24/16 1028 Last data filed at 09/23/16 1200  Gross per 24 hour  Intake                0 ml  Output                0 ml  Net                0 ml  LBM: Last BM Date: 09/22/16 Baseline Weight: Weight: 55.8 kg (123 lb) Most recent weight: Weight: 61.2 kg (135 lb)       Palliative Assessment/Data: 10%   Flowsheet Rows     Most Recent Value  Intake Tab  Referral Department  Hospitalist  Unit at Time of Referral  Oncology Unit  Palliative Care Primary Diagnosis  Sepsis/Infectious Disease  Date Notified  09/22/16  Palliative Care Type  New Palliative care  Reason for referral  Clarify Goals of Care, Counsel Regarding Hospice  Date of Admission  09/21/16  # of days IP prior to Palliative referral  1  Clinical Assessment  Psychosocial & Spiritual Assessment  Palliative Care Outcomes      Patient Active Problem List   Diagnosis Date Noted  . Urinary tract infection associated with catheterization of urinary tract, initial encounter (HCC) 09/24/2016  . Sepsis due to Escherichia coli (E. coli) (HCC) 09/24/2016  . Acute renal insufficiency 09/24/2016  . Demand ischemia (HCC) 09/24/2016  . Lactic acidosis 09/24/2016  . Leukocytosis 09/24/2016  . Episode of unresponsiveness 09/24/2016  . Hypokalemia 09/24/2016  . Generalized weakness 09/24/2016  . Acute urinary retention 09/24/2016  . Goals of care, counseling/discussion   . Palliative care encounter   . Sepsis (HCC) 09/21/2016  . Hydrocele, right 09/01/2016  . Urinary retention 08/31/2016  . Hypertension 11/06/2015  . Dementia 11/06/2015  . Hyperlipidemia 11/06/2015    Palliative Care Assessment & Plan   HPI: 83 y.o. male  with past medical history of dementia, arthritis, hypertension, stroke admitted on 09/21/2016 with sepsis r/t UTI. E. coli showing in blood cultures. Seizure activity noted 09/23/16 evening and given ativan  1 mg IV. 09/24/16 he is minimally responsive and not even alert enough for intake.   Assessment: I had a long discussion with son/HCPOA, Larry Davidson, who is very realistic about his father's condition and that prognosis could be very poor. He is aware of Larry Davidson's acute decline. After discussion we have decided that if Larry Davidson does not have significant improvement by tomorrow morning we will pursue comfort and hospice placement. Will monitor today and tonight to allow post-ictal state and/or ativan to wean and allow time for improvement if this will occur. CT head negative, EEG pending, no significant changes in labs, lactate is down. Do not see need for MRI as this is unlikely to change plan of care. Son is realistic that he may not improve and is clear he would not want him prolonged in this state. Larry Davidson plans to discuss more with his mother as she is not aware of the severity of his condition or consideration of hospice.   Recommendations/Plan:  Pain from urinary retention relieved with catheter placement. Continue Proscar and Flomax. Consider urology input if continued issues for further assessment/recommendations - hold unless he improves.   Goals of Care and Additional Recommendations:  Limitations on Scope of Treatment: No Artificial Feeding and No Tracheostomy, no aggressive treatments desired  Code Status:  DNR  Prognosis:   In current state prognosis is poor and < 2 weeks  Discharge Planning:  To Be Determined   Thank you for allowing the Palliative Medicine Team to assist in the care of this patient.   Total Time 25min Prolonged Time Billed  no       Greater than 50%  of this time was spent counseling and coordinating care related to the above assessment and plan.  Yong ChannelAlicia Mairyn Lenahan, NP Palliative Medicine Team Pager # [phone] (M-F 8a-5p) Team  Phone # [phone] (Nights/Weekends)

## 2016-09-24 NOTE — Progress Notes (Signed)
PT Cancellation Note  Patient Details Name: Winona LegatoHarold E Hedges MRN: [id number] DOB: January 06, 1924   Cancelled Treatment:    Reason Eval/Treat Not Completed: Other (comment). Per chart review, pt continues to be lethargic this date, minimally responsive. Not appropriate for PT at this time. Will hold and re-attempt if appropriate.   Zeyna Mkrtchyan 09/24/2016, 3:34 PM  Elizabeth PalauStephanie Kamarri Fischetti, PT, DPT [phone]

## 2016-09-24 NOTE — Care Management Important Message (Signed)
Important Message  Patient Details  Name: Larry Davidson MRN: [id number] Date of Birth: 03-15-1924   Medicare Important Message Given:  Yes    [name], RN 09/24/2016, 8:13 AM

## 2016-09-24 NOTE — Progress Notes (Signed)
[facility] -  at [facility]   PATIENT NAME: Larry Davidson    MR#:  [id number]  DATE OF BIRTH:  1923-04-06  SUBJECTIVE:  CHIEF COMPLAINT:   Chief Complaint  Patient presents with  . Altered Mental Status   Review of Systems  Unable to perform ROS: Dementia  Gastrointestinal: Positive for abdominal pain.   The patient is a 83 year old Caucasian male who is with history of dementia, BPH, Foley catheter removal about a week ago, who presents to the hospital with fever, weakness, fall. On arrival he was noted to be tachycardic with heart rate around 120, febrile  To about 102. Labs revealed acute renal insufficiency with creatinine 1.65, hypokalemia, mild elevation of troponin, lactic acidosis, leukocytosis, pyuria. Hospitalist where consulted for admission. Patient was admitted and initiated on Zosyn. His labs revealed improvement, including improved. Her white blood cell count. Fever have subsided. Blood cultures revealed gram-negative rods, PCR was positive for Escherichia coli, urine culture  positive for Escherichia coli, pansensitive. Patient complains of lower abdominal pain on palpation, bladder scan revealed more than 500 cc of urine, Foley catheter is placed, drained more than 500 cc..  Patient became Unresponsive 09/23/2016, developed brief episode of facial and left arm twitching, concerning for seizure, hypertensive, though exam is pending, repeated CT of head was unremarkable. EEG showed no epileptiform activity. Patient remains somewhat somnolent, does not engage, oral intake is poor  VITAL SIGNS: Blood pressure (!) 145/67, pulse 86, temperature (!) 96.7 F (35.9 C), temperature source Axillary, resp. rate 20, height 5\' 7"  (1.702 m), weight 61.2 kg (135 lb), SpO2 93 %.  PHYSICAL EXAMINATION:   GENERAL:  83 y.o.-year-old patient layingon the bed with no acute distress, mumbles by himself, not able to express himself. Not able to open eyes,  minimal interaction EYES: Pupils equal, round, reactive to light and accommodation. No scleral icterus. Extraocular muscles intact.  HEENT: Head atraumatic, normocephalic. Oropharynx and nasopharynx clear.  NECK:  Supple, no jugular venous distention. No thyroid enlargement, no tenderness.  LUNGS: Diminished breath sounds bilaterally, no wheezing, rales,rhonchi or crepitation. No use of accessory muscles of respiration.  CARDIOVASCULAR: S1, S2 normal. No murmurs, rubs, or gallops.  ABDOMEN: Soft, mild tenderness suprapubically, but no rebound or guarding was noted, nondistended. Bowel sounds present. No organomegaly or mass. Urine is draining in Foley catheter EXTREMITIES: No pedal edema, cyanosis, or clubbing.  NEUROLOGIC: Cranial nerves II through XII aregrossly intact. Muscle moves all extremities. Sensation unable to assess as patient is demented. Gait not checked.  PSYCHIATRIC: The patient is alert unable to assess orientation.  SKIN: No obvious rash, lesion, or ulcer.   ORDERS/RESULTS REVIEWED:   CBC  Recent Labs Lab 09/21/16 1049 09/22/16 0444 09/23/16 1941 09/24/16 0433  WBC 16.2* 14.4* 8.3 9.1  HGB 12.7* 12.3* 12.6* 13.5  HCT 38.1* 36.1* 37.2* 39.8*  PLT 185 165 203 201  MCV 90.4 89.9 90.5 90.5  MCH 30.1 30.6 30.7 30.6  MCHC 33.3 34.1 34.0 33.8  RDW 14.8* 15.3* 15.3* 15.5*  LYMPHSABS 0.4*  --   --   --   MONOABS 1.0  --   --   --   EOSABS 0.0  --   --   --   BASOSABS 0.0  --   --   --    ------------------------------------------------------------------------------------------------------------------  Chemistries   Recent Labs Lab 09/21/16 1049 09/22/16 0444 09/23/16 1941  NA 139 140 142  K 3.4* 4.1 3.0*  CL 106 111 110  CO2 24 22 25   GLUCOSE 120* 86 182*  BUN 29* 31* 21*  CREATININE 1.65* 1.46* 1.21  CALCIUM 9.6 9.5 9.2  AST 26  --  32  ALT 13*  --  18  ALKPHOS 55  --  44  BILITOT 0.9  --  0.5    ------------------------------------------------------------------------------------------------------------------ estimated creatinine clearance is 35.1 mL/min (by C-G formula based on SCr of 1.21 mg/dL). ------------------------------------------------------------------------------------------------------------------ No results for input(s): TSH, T4TOTAL, T3FREE, THYROIDAB in the last 72 hours.  Invalid input(s): FREET3  Cardiac Enzymes  Recent Labs Lab 09/23/16 1941 09/23/16 2309 09/24/16 0433  TROPONINI 0.04* 0.03* 0.07*   ------------------------------------------------------------------------------------------------------------------ Invalid input(s): POCBNP ---------------------------------------------------------------------------------------------------------------  RADIOLOGY: Ct Head Wo Contrast  Result Date: 09/23/2016 CLINICAL DATA:  Lethargy EXAM: CT HEAD WITHOUT CONTRAST TECHNIQUE: Contiguous axial images were obtained from the base of the skull through the vertex without intravenous contrast. COMPARISON:  CT 09/21/2016 FINDINGS: Brain: Moderate atrophy. Advanced chronic microvascular ischemic change in the white matter bilaterally. Chronic ischemia in the pons. Chronic infarcts in the cerebellum bilaterally, left greater than right Negative for acute infarct.  Negative for acute hemorrhage or mass. Vascular: Negative for hyperdense vessel Skull: Negative Sinuses/Orbits: Mucosal edema paranasal sinuses. Bilateral lens replacement. Other: None IMPRESSION: Atrophy and advanced chronic ischemic change.  No acute abnormality. Electronically Signed   By: Marlan Palauharles  Clark M.D.   On: 09/23/2016 16:47   [place] Renal  Result Date: 09/23/2016 CLINICAL DATA:  83 year old male with acute renal insufficiency. EXAM: RENAL / URINARY TRACT ULTRASOUND COMPLETE COMPARISON:  None. FINDINGS: Right Kidney: Length: 9.9 cm. Cortical thinning and increased renal echogenicity noted. Multiple cysts are  present. No hydronephrosis or definite solid mass. Left Kidney: Length: 10.5 cm. Cortical thinning and increased renal echogenicity noted. Multiple cysts are present. No hydronephrosis or definite solid mass. A 1.3 cm nonobstructing calculus within the mid left kidney is noted. Bladder: A Foley catheter is present within the bladder. IMPRESSION: Cortical thinning and increased renal echogenicity, compatible with medical renal disease. No evidence of acute abnormality.  No evidence of hydronephrosis. 1.3 cm nonobstructing left renal calculus. Multiple bilateral renal cysts. Electronically Signed   By: Harmon PierJeffrey  Hu M.D.   On: 09/23/2016 18:20    EKG:  Orders placed or performed during the hospital encounter of 09/21/16  . ED EKG 12-Lead  . ED EKG 12-Lead  . EKG 12-Lead  . EKG 12-Lead    ASSESSMENT AND PLAN:  Active Problems:   Sepsis (HCC)   Goals of care, counseling/discussion   Palliative care encounter   Urinary tract infection associated with catheterization of urinary tract, initial encounter (HCC)   Sepsis due to Escherichia coli (E. coli) (HCC)   Acute renal insufficiency   Demand ischemia (HCC)   Lactic acidosis   Leukocytosis   Episode of unresponsiveness   Hypokalemia   Generalized weakness   Acute urinary retention  #1. Escherichia coli sepsis, Now on Keflex orally  white blood cell count has improved, as well as fever curve, however, patient is not doing well clinically #2. Urinary tract infection due to Escherichia coli, continue antibiotic therapy with oral Keflex  #3. Acute renal insufficiency, improved with IV fluid administration, continue IV fluids, renal ultrasound revealed no hydronephrosis, patient developed acute urinary retention, status post Foley catheter replacement 09/23/2016 #4 demand ischemia, Echo showed normal ejection fraction, no further interventions #5. Lactic  acidosis, continue IV fluids, lactic acid level has improved #6. Leukocytosis, resolved #7.  Dementia with questionable seizure episode yesterday, electroencephalogram did not  show epileptiform activity, repeat the CT scan of head was unremarkable, patient was again seen by palliative care, who discussed case with patient's family, following patient clinically and making decisions in regards to placement, depending on clinical improvement, #8. Unresponsive episode, etiology is unclear, ABGs revealed hypoxia, , head CT revealed no new changes,electroencephalogram showed no epileptiform activity, supportive care at present, continue telemetry for now  Management plans discussed with the patient, family and they are in agreement.   DRUG ALLERGIES: No Known Allergies  CODE STATUS:     Code Status Orders        Start     Ordered   09/21/16 1340  Do not attempt resuscitation (DNR)  Continuous    Question Answer Comment  In the event of cardiac or respiratory ARREST Do not call a "code blue"   In the event of cardiac or respiratory ARREST Do not perform Intubation, CPR, defibrillation or ACLS   In the event of cardiac or respiratory ARREST Use medication by any route, position, wound care, and other measures to relive pain and suffering. May use oxygen, suction and manual treatment of airway obstruction as needed for comfort.      09/21/16 1339    Code Status History    Date Active Date Inactive Code Status Order ID Comments User Context   This patient has a current code status but no historical code status.    Advance Directive Documentation     Most Recent Value  Type of Advance Directive  Healthcare Power of Attorney, Living will  Pre-existing out of facility DNR order (yellow form or pink MOST form)  -  "MOST" Form in Place?  -      TOTAL TIME TAKING CARE OF THIS PATIENT: 35 minutes.    Katharina Caper M.D on 09/24/2016 at 5:24 PM  Between 7am to 6pm - Pager - [phone]  After 6pm go to [email or web address] - password [email or web address] [facility]  [facility]  Office   [phone]  CC: Primary care physician; Maple Hudson., MD

## 2016-09-25 DIAGNOSIS — N320 Bladder-neck obstruction: Secondary | ICD-10-CM

## 2016-09-25 DIAGNOSIS — R5383 Other fatigue: Secondary | ICD-10-CM

## 2016-09-25 DIAGNOSIS — R627 Adult failure to thrive: Secondary | ICD-10-CM

## 2016-09-25 LAB — MAGNESIUM: Magnesium: 2.1 mg/dL (ref 1.7–2.4)

## 2016-09-25 LAB — TROPONIN I: Troponin I: <0.03 ng/mL (ref <0.03)

## 2016-09-25 LAB — POTASSIUM: POTASSIUM: 4 mmol/L (ref 3.5–5.1)

## 2016-09-25 NOTE — Progress Notes (Signed)
Per [name]/ [name] hospice liaison patient can come to the hospice facility today. MD prepared D/C orders. Clinical Child psychotherapistocial Worker (CSW) prepared D/C packet including DNR form. RN aware of above. CSW contacted patient's son Larry Davidson and confirmed that family is in agreement with D/C plan. Please reconsult if future social work needs arise. CSW signing off.   Baker Hughes IncorporatedBailey Sharetta Ricchio, LCSW [phone]

## 2016-09-25 NOTE — Progress Notes (Signed)
Daily Progress Note   Patient Name: Larry Davidson       Date: 09/25/2016 DOB: Feb 12, 1924  Age: 83 y.o. MRN#: [id number] Attending Physician: Theodoro Grist, MD Primary Care Physician: Jerrol Banana., MD Admit Date: 09/21/2016  Reason for Consultation/Follow-up: Establishing goals of care and Hospice Evaluation  Subjective: Larry Davidson continues to be lethargic and arouses with much stimulation just for a few moments.    Length of Stay: 4  Current Medications: Scheduled Meds:  . amLODipine  5 mg Oral QHS  . budesonide (PULMICORT) nebulizer solution  0.25 mg Nebulization BID  . cephALEXin  500 mg Oral Q12H  . clopidogrel  75 mg Oral Daily  . donepezil  10 mg Oral Daily  . finasteride  5 mg Oral Daily  . ipratropium-albuterol  3 mL Nebulization TID  . mirtazapine  30 mg Oral QHS  . tamsulosin  0.4 mg Oral Daily    Continuous Infusions: . sodium chloride 75 mL/hr at 09/24/16 2250    PRN Meds: acetaminophen **OR** acetaminophen, bisacodyl, ibuprofen, ipratropium-albuterol, LORazepam, ondansetron **OR** ondansetron (ZOFRAN) IV, senna-docusate  Physical Exam  Constitutional: He appears well-developed.  Thin, frail  Cardiovascular: Normal rate.   Pulmonary/Chest: No accessory muscle usage. No tachypnea. No respiratory distress. He has decreased breath sounds.  Abdominal: Soft. Normal appearance.  Neurological:  Minimally responsive. Arouses for just seconds with much verbal and tactile stimulation.  Nursing note and vitals reviewed.         Vital Signs: BP 136/62 (BP Location: Left Arm)   Pulse 92   Temp 98.7 F (37.1 C) (Oral)   Resp (!) 22   Ht 5' 7"  (1.702 m)   Wt 54.4 kg (120 lb)   SpO2 95%   BMI 18.79 kg/m  SpO2: SpO2: 95 % O2 Device: O2 Device:  Not Delivered O2 Flow Rate:    Intake/output summary:   Intake/Output Summary (Last 24 hours) at 09/25/16 0934 Last data filed at 09/25/16 0436  Gross per 24 hour  Intake          1031.25 ml  Output             1325 ml  Net          -293.75 ml   LBM: Last BM Date: 09/23/16 Baseline Weight: Weight: 55.8 kg (123  lb) Most recent weight: Weight: 54.4 kg (120 lb)       Palliative Assessment/Data: 10%   Flowsheet Rows     Most Recent Value  Intake Tab  Referral Department  Hospitalist  Unit at Time of Referral  Oncology Unit  Palliative Care Primary Diagnosis  Sepsis/Infectious Disease  Date Notified  09/22/16  Palliative Care Type  New Palliative care  Reason for referral  Clarify Goals of Care, Counsel Regarding Hospice  Date of Admission  09/21/16  # of days IP prior to Palliative referral  1  Clinical Assessment  Psychosocial & Spiritual Assessment  Palliative Care Outcomes      Patient Active Problem List   Diagnosis Date Noted  . Urinary tract infection associated with catheterization of urinary tract, initial encounter ([place]) 09/24/2016  . Sepsis due to Escherichia coli (E. coli) ([place]) 09/24/2016  . Acute renal insufficiency 09/24/2016  . Demand ischemia ([place]) 09/24/2016  . Lactic acidosis 09/24/2016  . Leukocytosis 09/24/2016  . Episode of unresponsiveness 09/24/2016  . Hypokalemia 09/24/2016  . Generalized weakness 09/24/2016  . Acute urinary retention 09/24/2016  . Goals of care, counseling/discussion   . Palliative care encounter   . Sepsis ([place]) 09/21/2016  . Hydrocele, right 09/01/2016  . Urinary retention 08/31/2016  . Hypertension 11/06/2015  . Dementia 11/06/2015  . Hyperlipidemia 11/06/2015    Palliative Care Assessment & Plan   HPI: 83 y.o. male  with past medical history of dementia, arthritis, hypertension, stroke admitted on 09/21/2016 with sepsis r/t UTI. E. coli showing in blood cultures. Seizure activity noted 09/23/16 evening and given  ativan 1 mg IV. 09/24/16 he is minimally responsive and not even alert enough for intake.   Assessment: I met again with son/HCPOA, Scott. Nicki Reaper says that he has spoken with the doctor and that the plan is for rehab. We discussed SNF rehab vs hospice facility and aggressiveness of care. Nicki Reaper seems concerned with therapy and eating. I explained that Larry Davidson would have to be more alert/awake for him to be given food/drink in any location d/t risk of aspiration and reassured that hospice would give him food/drink when alert enough to have intake. I also explained that unfortunately Larry Davidson will not be able to participate in rehab so this may not be an option. We discussed insurance approval based on PT recommendations. Nicki Reaper would still like to pursue rehab at this time but understands this may not be an option. He wants his father to have all the chances he can have but also wishes for him to be comfortable.   Recommendations/Plan:  Pain from urinary retention relieved with catheter placement. Continue Proscar and Flomax if able to take po.   Goals of Care and Additional Recommendations:  Limitations on Scope of Treatment: No Artificial Feeding and No Tracheostomy, no aggressive treatments desired  Code Status:  DNR  Prognosis:   In current state prognosis is poor and < 2 weeks  Discharge Planning:  To Be Determined. Recommend hospice facility.    Thank you for allowing the Palliative Medicine Team to assist in the care of this patient.   Total Time 71mn Prolonged Time Billed  no       Greater than 50%  of this time was spent counseling and coordinating care related to the above assessment and plan.  AVinie Sill NP Palliative Medicine Team Pager # [phone](M-F 8a-5p) Team Phone # [phone](Nights/Weekends)

## 2016-09-25 NOTE — Clinical Social Work Note (Signed)
Clinical Social Work Assessment  Patient Details  Name: Larry Davidson MRN: [id number] Date of Birth: 05-Jul-1923  Date of referral:  09/25/16               Reason for consult:  Discharge Planning, End of Life/Hospice, Facility Placement                Permission sought to share information with:  Other [facility] liaison ) Permission granted to share information::  Yes, Verbal Permission Granted  Name::        Agency::     Relationship::     Contact Information:     Housing/Transportation Living arrangements for the past 2 months:  Single Family Home Source of Information:  Adult Children, Spouse, Other (Comment Required) Yolanda Bonine. ) Patient Interpreter Needed:  None Criminal Activity/Legal Involvement Pertinent to Current Situation/Hospitalization:  No - Comment as needed Significant Relationships:  Adult Children, Spouse, Other Family Members Lives with:  Spouse Do you feel safe going back to the place where you live?    Need for family participation in patient care:  Yes (Comment)  Care giving concerns:  Patient lives in [place] with his wife Larry Davidson.    Social Worker assessment / plan:  Holiday representative ([employer]) received verbal consult from treatment team to discuss D/C options with the family. Per palliative NP patient is hospice home appropriate. CSW met with patient's wife and grandson at bedside. Patient did not participate in assessment. Wife contacted her son Larry Davidson [phone] and put him on speaker phone while CSW was in the room. CSW explained that per PT patient can't participate and is not appropriate for short term rehab at a SNF. CSW explained that insurance will not pay for patient to go to rehab at a SNF. CSW explained private pay options for SNF and hospice at home and in a hospice facility. Patient's wife Larry Davidson, son Larry Davidson and grandson all agreed to a hospice facility and chose [name] facility. Referral was made to [facility] hospice liaison. CSW will continue to follow and assist as needed.   Employment status:  Disabled (Comment on whether or not currently receiving Disability) Insurance information:  Managed Medicare PT Recommendations:  No Follow Up Information / Referral to community resources:  Other (Comment Required) [facility] Facility Referral. )  Patient/Family's Response to care:  Patient's family is agreeable to hospice facility in [place].   Patient/Family's Understanding of and Emotional Response to Diagnosis, Current Treatment, and Prognosis:  Patient's family was very pleasant and thanked CSW for assistance.   Emotional Assessment Appearance:  Appears stated age Attitude/Demeanor/Rapport:  Unable to Assess, Unresponsive Affect (typically observed):  Unable to Assess Orientation:  Fluctuating Orientation (Suspected and/or reported Sundowners) Alcohol / Substance use:  Not Applicable Psych involvement (Current and /or in the community):  No (Comment)  Discharge Needs  Concerns to be addressed:  Discharge Planning Concerns Readmission within the last 30 days:  No Current discharge risk:  Dependent with Mobility, Cognitively Impaired, Chronically ill, Terminally ill Barriers to Discharge:  Continued Medical Work up   [employer], Veronia Beets, LCSW 09/25/2016, 2:18 PM

## 2016-09-25 NOTE — Progress Notes (Signed)
New hospice home referral received from [place]. Patient is a 83 year old man with a past medical history of dementia, arthritis, hypertension and stroke, admitted to [facility] on 09/21/2016 with sepsis r/t UTI. He has been treated with IV antibiotics and was originally thought to be discharging home. He declined with periods of unresponsiveness. Head CT, [place] and EEG ordered by attending MD. All negative for any acute process. Patient has had minimal oral intake over the past 2 days and family has met again with patient's family, they have chosen to focus on his comfort with transfer to the hospice home today.  Patient seen in the room, wife and grandson at bedside. Patient was alert and interactive in the early aprt of the visit, oriented to family and self, he then fell asleep and did not participate in the conversation. Writer initiated education with patient's wife, grandson and son Nicki Reaper (who was on the phone) regarding hospice services, philosophy and team approach to care with good understanding voiced. Questions answered. Consents signed. Family is still hopeful taht patient may improve "with the good care at the hospice home", but did voice their understanding that with out IV fluids and better oral intake he will most likely decline. Emotional support given. Patient information faxed to referral, report called to the hospice home, EMS notified for transport. Hospital care team all aware. Signed portabel DNR in place in discharge packet. Thank you. Flo Shanks RN, BSN, [facility] [place], hospital liaison [phone] c

## 2016-09-25 NOTE — Discharge Summary (Signed)
[facility] - [place] at [facility]   PATIENT NAME: Larry Davidson    MR#:  [id number]  DATE OF BIRTH:  1923/09/19  DATE OF ADMISSION:  09/21/2016 ADMITTING PHYSICIAN: Adrian Saran, MD  DATE OF DISCHARGE: No discharge date for patient encounter.  PRIMARY CARE PHYSICIAN: Maple Hudson., MD     ADMISSION DIAGNOSIS:  Sepsis, due to unspecified organism [facility]) [A41.9]  DISCHARGE DIAGNOSIS:  Active Problems:   Sepsis (HCC)   Goals of care, counseling/discussion   Palliative care encounter   Urinary tract infection associated with catheterization of urinary tract, initial encounter (HCC)   Sepsis due to Escherichia coli (E. coli) (HCC)   Acute renal insufficiency   Demand ischemia (HCC)   Lactic acidosis   Leukocytosis   Episode of unresponsiveness   Hypokalemia   Generalized weakness   Acute urinary retention   Lethargy   Bladder outlet obstruction   Failure to thrive in adult   SECONDARY DIAGNOSIS:   Past Medical History:  Diagnosis Date  . Arthritis   . Dementia   . Hypertension   . Stroke (HCC)     .pro HOSPITAL COURSE:   The patient is a 83 year old Caucasian male who is with history of dementia, BPH, Foley catheter removal about a week ago, who presents to the hospital with fever, weakness, fall. On arrival he was noted to be tachycardic with heart rate around 120, febrile  to about 102. Labs revealed acute renal insufficiency with creatinine 1.65, hypokalemia, mild elevation of troponin, lactic acidosis, leukocytosis, pyuria. Hospitalist where consulted for admission. Patient was admitted and initiated on Zosyn. With this conservative therapy his labs revealed improvement, including improved white blood cell count, lactic acid level, renal function, fever have subsided. Blood cultures and urine culture wasn't patient's antibiotic was changed to Keflex orally, patient is to continue antibiotic for total of 14 days to complete  course. While in the hospital patient was noted to have problems with voiding, had to be catheterized and Foley catheter was placed . It is recommended to discontinue Foley catheter in R week  of antibiotic therapy for voiding trial. Patient became unresponsive 09/23/2016, developed brief episode of facial and left arm twitching, concerning for seizure,  repeated CT of head was unremarkable. EEG showed no epileptiform activity. Despite conservative therapy, patient remained somnolent, did not engage, oral intake was poor, palliative care consultation was requested and patient's family decided on  hospice home. Patient will be discharged to hospice home today Discussion by problem: #1. Escherichia coli sepsis, continue Keflex orally,   12 more days ,  white blood cell count has improved, as well as fever curve, however, patient was not doing well clinically, did not respond well, despite conservative therapy, oral intake remained negligent, patient's family decided on hospice home placement #2. Urinary tract infection due to Escherichia coli, continue antibiotic therapy with oral Keflex , follow clinically #3. Acute renal insufficiency, improved with IV fluid administration, renal ultrasound revealed no hydronephrosis, patient developed acute urinary retention, status post Foley catheter replacement 09/23/2016, may discontinue Foley catheter in about 1 week for voiding trial #4 demand ischemia, Echo showed normal ejection fraction, no further interventions #5. Lactic  acidosis,  lactic acid level has improved with conservative therapy #6. Leukocytosis, resolved #7. Dementia with unresponsive episode, questionable seizure episode , electroencephalogram did not show epileptiform activity, repeated CT scan of head was unremarkable, patient was seen by palliative care, decision was made to transfer patient to hospice  home today #8. Unresponsive episode, etiology is unclear, ABGs revealed mild hypoxia, , head CT  revealed no new changes, electroencephalogram showed no epileptiform activity, no abnormalities on telemetry, no further interventions were recommended, supportive therapy only  DISCHARGE CONDITIONS:   Poor  CONSULTS OBTAINED:    DRUG ALLERGIES:  No Known Allergies  DISCHARGE MEDICATIONS:   Current Discharge Medication List    START taking these medications   Details  cephALEXin (KEFLEX) 500 MG capsule Take 1 capsule (500 mg total) by mouth every 12 (twelve) hours. Qty: 24 capsule, Refills: 0      CONTINUE these medications which have NOT CHANGED   Details  amLODipine (NORVASC) 5 MG tablet Take 1 tablet (5 mg total) by mouth at bedtime. Qty: 90 tablet, Refills: 3   Associated Diagnoses: Essential hypertension    Cholecalciferol (VITAMIN D-1000 MAX ST) 1000 units tablet Take by mouth.    clopidogrel (PLAVIX) 75 MG tablet TAKE 1 TABLET DAILY Qty: 90 tablet, Refills: 3    donepezil (ARICEPT) 10 MG tablet TAKE 1 TABLET DAILY Qty: 90 tablet, Refills: 3    finasteride (PROSCAR) 5 MG tablet Take 1 tablet (5 mg total) by mouth daily. Qty: 90 tablet, Refills: 3   Associated Diagnoses: Urinary retention    mirtazapine (REMERON) 30 MG tablet Take 1 tablet (30 mg total) by mouth at bedtime. Qty: 90 tablet, Refills: 1    tamsulosin (FLOMAX) 0.4 MG CAPS capsule Take 1 capsule (0.4 mg total) by mouth daily. Qty: 90 capsule, Refills: 3   Associated Diagnoses: Urinary retention         DISCHARGE INSTRUCTIONS:    The patient is to follow-up with primary care physician depending on clinical progression  If you experience worsening of your admission symptoms, develop shortness of breath, life threatening emergency, suicidal or homicidal thoughts you must seek medical attention immediately by calling 911 or calling your MD immediately  if symptoms less severe.  You Must read complete instructions/literature along with all the possible adverse reactions/side effects for all the  Medicines you take and that have been prescribed to you. Take any new Medicines after you have completely understood and accept all the possible adverse reactions/side effects.   Please note  You were cared for by a hospitalist during your hospital stay. If you have any questions about your discharge medications or the care you received while you were in the hospital after you are discharged, you can call the unit and asked to speak with the hospitalist on call if the hospitalist that took care of you is not available. Once you are discharged, your primary care physician will handle any further medical issues. Please note that NO REFILLS for any discharge medications will be authorized once you are discharged, as it is imperative that you return to your primary care physician (or establish a relationship with a primary care physician if you do not have one) for your aftercare needs so that they can reassess your need for medications and monitor your lab values.    Today   CHIEF COMPLAINT:   Chief Complaint  Patient presents with  . Altered Mental Status    HISTORY OF PRESENT ILLNESS:     VITAL SIGNS:  Blood pressure 136/62, pulse 92, temperature 98.7 F (37.1 C), temperature source Oral, resp. rate (!) 22, height 5\' 7"  (1.702 m), weight 54.4 kg (120 lb), SpO2 94 %.  I/O:   Intake/Output Summary (Last 24 hours) at 09/25/16 1449 Last data filed at 09/25/16 [phone]  Gross per 24 hour  Intake          1031.25 ml  Output             1325 ml  Net          -293.75 ml    PHYSICAL EXAMINATION:  GENERAL:  83 y.o.-year-old patient lying in the bed with no acute distress.  EYES: Pupils equal, round, reactive to light and accommodation. No scleral icterus. Extraocular muscles intact.  HEENT: Head atraumatic, normocephalic. Oropharynx and nasopharynx clear.  NECK:  Supple, no jugular venous distention. No thyroid enlargement, no tenderness.  LUNGS: Normal breath sounds bilaterally, no  wheezing, rales,rhonchi or crepitation. No use of accessory muscles of respiration.  CARDIOVASCULAR: S1, S2 normal. No murmurs, rubs, or gallops.  ABDOMEN: Soft, non-tender, non-distended. Bowel sounds present. No organomegaly or mass.  EXTREMITIES: No pedal edema, cyanosis, or clubbing.  NEUROLOGIC: Cranial nerves II through XII are intact. Muscle strength 5/5 in all extremities. Sensation intact. Gait not checked.  PSYCHIATRIC: The patient is alert and oriented x 3.  SKIN: No obvious rash, lesion, or ulcer.   DATA REVIEW:   CBC  Recent Labs Lab 09/24/16 0433  WBC 9.1  HGB 13.5  HCT 39.8*  PLT 201    Chemistries   Recent Labs Lab 09/23/16 1941 09/25/16 0328  NA 142  --   K 3.0* 4.0  CL 110  --   CO2 25  --   GLUCOSE 182*  --   BUN 21*  --   CREATININE 1.21  --   CALCIUM 9.2  --   MG  --  2.1  AST 32  --   ALT 18  --   ALKPHOS 44  --   BILITOT 0.5  --     Cardiac Enzymes  Recent Labs Lab 09/25/16 0328  TROPONINI <0.03    Microbiology Results  Results for orders placed or performed during the hospital encounter of 09/21/16  Blood Culture (routine x 2)     Status: Abnormal   Collection Time: 09/21/16 10:49 AM  Result Value Ref Range Status   Specimen Description BLOOD BLOOD LEFT FOREARM  Final   Special Requests   Final    BOTTLES DRAWN AEROBIC AND ANAEROBIC Blood Culture adequate volume   Culture  Setup Time   Final    GRAM NEGATIVE RODS IN BOTH AEROBIC AND ANAEROBIC BOTTLES CRITICAL RESULT CALLED TO, READ BACK BY AND VERIFIED WITH: MATT MCBANE @ 09/22/2016 BY CAF Performed at [facility], [address], [place], [place] [address]    Culture ESCHERICHIA COLI (A)  Final   Report Status 09/24/2016 FINAL  Final   Organism ID, Bacteria ESCHERICHIA COLI  Final      Susceptibility   Escherichia coli - MIC*    AMPICILLIN <=2 SENSITIVE Sensitive     CEFAZOLIN <=4 SENSITIVE Sensitive     CEFEPIME <=1 SENSITIVE Sensitive     CEFTAZIDIME <=1  SENSITIVE Sensitive     CEFTRIAXONE <=1 SENSITIVE Sensitive     CIPROFLOXACIN <=0.25 SENSITIVE Sensitive     GENTAMICIN <=1 SENSITIVE Sensitive     IMIPENEM <=0.25 SENSITIVE Sensitive     TRIMETH/SULFA <=20 SENSITIVE Sensitive     AMPICILLIN/SULBACTAM <=2 SENSITIVE Sensitive     PIP/TAZO <=4 SENSITIVE Sensitive     Extended ESBL NEGATIVE Sensitive     * ESCHERICHIA COLI  Blood Culture (routine x 2)     Status: Abnormal   Collection Time: 09/21/16 10:49 AM  Result Value Ref Range Status   Specimen Description BLOOD BLOOD RIGHT FOREARM  Final   Special Requests   Final    BOTTLES DRAWN AEROBIC AND ANAEROBIC Blood Culture adequate volume   Culture  Setup Time   Final    GRAM NEGATIVE RODS CRITICAL RESULT CALLED TO, READ BACK BY AND VERIFIED WITH: MATT MCBANE @ 0259 ON 09/22/2016 BY CAF IN BOTH AEROBIC AND ANAEROBIC BOTTLES GRAM STAIN REVIEWED-AGREE WITH RESULT 1127 09/22/2016 T. TYSOR    Culture (A)  Final    ESCHERICHIA COLI SUSCEPTIBILITIES PERFORMED ON PREVIOUS CULTURE WITHIN THE LAST 5 DAYS. Performed at [facility], [address] [place], [place] [address]    Report Status 09/24/2016 FINAL  Final  Urine culture     Status: Abnormal   Collection Time: 09/21/16 10:49 AM  Result Value Ref Range Status   Specimen Description URINE, RANDOM  Final   Special Requests NONE  Final   Culture >=100,000 COLONIES/mL ESCHERICHIA COLI (A)  Final   Report Status 09/23/2016 FINAL  Final   Organism ID, Bacteria ESCHERICHIA COLI (A)  Final      Susceptibility   Escherichia coli - MIC*    AMPICILLIN 4 SENSITIVE Sensitive     CEFAZOLIN <=4 SENSITIVE Sensitive     CEFTRIAXONE <=1 SENSITIVE Sensitive     CIPROFLOXACIN <=0.25 SENSITIVE Sensitive     GENTAMICIN <=1 SENSITIVE Sensitive     IMIPENEM <=0.25 SENSITIVE Sensitive     NITROFURANTOIN <=16 SENSITIVE Sensitive     TRIMETH/SULFA <=20 SENSITIVE Sensitive     AMPICILLIN/SULBACTAM <=2 SENSITIVE Sensitive     PIP/TAZO <=4 SENSITIVE  Sensitive     Extended ESBL NEGATIVE Sensitive     * >=100,000 COLONIES/mL ESCHERICHIA COLI  Blood Culture ID Panel (Reflexed)     Status: Abnormal   Collection Time: 09/21/16 10:49 AM  Result Value Ref Range Status   Enterococcus species NOT DETECTED NOT DETECTED Final   Listeria monocytogenes NOT DETECTED NOT DETECTED Final   Staphylococcus species NOT DETECTED NOT DETECTED Final   Staphylococcus aureus NOT DETECTED NOT DETECTED Final   Streptococcus species NOT DETECTED NOT DETECTED Final   Streptococcus agalactiae NOT DETECTED NOT DETECTED Final   Streptococcus pneumoniae NOT DETECTED NOT DETECTED Final   Streptococcus pyogenes NOT DETECTED NOT DETECTED Final   Acinetobacter baumannii NOT DETECTED NOT DETECTED Final   Enterobacteriaceae species DETECTED (A) NOT DETECTED Final    Comment: Enterobacteriaceae represent a large family of gram-negative bacteria, not a single organism. CRITICAL RESULT CALLED TO, READ BACK BY AND VERIFIED WITH: MATT MCBANE @ 0259 ON 09/22/2016 BY CAF    Enterobacter cloacae complex NOT DETECTED NOT DETECTED Final   Escherichia coli DETECTED (A) NOT DETECTED Final    Comment: CRITICAL RESULT CALLED TO, READ BACK BY AND VERIFIED WITH: MATT MCBANE @ 0259 ON 09/22/2016 BY CAF    Klebsiella oxytoca NOT DETECTED NOT DETECTED Final   Klebsiella pneumoniae NOT DETECTED NOT DETECTED Final   Proteus species NOT DETECTED NOT DETECTED Final   Serratia marcescens NOT DETECTED NOT DETECTED Final   Carbapenem resistance NOT DETECTED NOT DETECTED Final   Haemophilus influenzae NOT DETECTED NOT DETECTED Final   Neisseria meningitidis NOT DETECTED NOT DETECTED Final   Pseudomonas aeruginosa NOT DETECTED NOT DETECTED Final   Candida albicans NOT DETECTED NOT DETECTED Final   Candida glabrata NOT DETECTED NOT DETECTED Final   Candida krusei NOT DETECTED NOT DETECTED Final   Candida parapsilosis NOT DETECTED NOT DETECTED Final  Candida tropicalis NOT DETECTED NOT  DETECTED Final    RADIOLOGY:  Ct Head Wo Contrast  Result Date: 09/23/2016 CLINICAL DATA:  Lethargy EXAM: CT HEAD WITHOUT CONTRAST TECHNIQUE: Contiguous axial images were obtained from the base of the skull through the vertex without intravenous contrast. COMPARISON:  CT 09/21/2016 FINDINGS: Brain: Moderate atrophy. Advanced chronic microvascular ischemic change in the white matter bilaterally. Chronic ischemia in the pons. Chronic infarcts in the cerebellum bilaterally, left greater than right Negative for acute infarct.  Negative for acute hemorrhage or mass. Vascular: Negative for hyperdense vessel Skull: Negative Sinuses/Orbits: Mucosal edema paranasal sinuses. Bilateral lens replacement. Other: None IMPRESSION: Atrophy and advanced chronic ischemic change.  No acute abnormality. Electronically Signed   By: Marlan Palau M.D.   On: 09/23/2016 16:47   US Renal  Result Date: 09/23/2016 CLINICAL DATA:  83 year old male with acute renal insufficiency. EXAM: RENAL / URINARY TRACT ULTRASOUND COMPLETE COMPARISON:  None. FINDINGS: Right Kidney: Length: 9.9 cm. Cortical thinning and increased renal echogenicity noted. Multiple cysts are present. No hydronephrosis or definite solid mass. Left Kidney: Length: 10.5 cm. Cortical thinning and increased renal echogenicity noted. Multiple cysts are present. No hydronephrosis or definite solid mass. A 1.3 cm nonobstructing calculus within the mid left kidney is noted. Bladder: A Foley catheter is present within the bladder. IMPRESSION: Cortical thinning and increased renal echogenicity, compatible with medical renal disease. No evidence of acute abnormality.  No evidence of hydronephrosis. 1.3 cm nonobstructing left renal calculus. Multiple bilateral renal cysts. Electronically Signed   By: Harmon Pier M.D.   On: 09/23/2016 18:20    EKG:   Orders placed or performed during the hospital encounter of 09/21/16  . ED EKG 12-Lead  . ED EKG 12-Lead  . EKG 12-Lead    . EKG 12-Lead      Management plans discussed with the patient, family and they are in agreement.  CODE STATUS:     Code Status Orders        Start     Ordered   09/21/16 1340  Do not attempt resuscitation (DNR)  Continuous    Question Answer Comment  In the event of cardiac or respiratory ARREST Do not call a "code blue"   In the event of cardiac or respiratory ARREST Do not perform Intubation, CPR, defibrillation or ACLS   In the event of cardiac or respiratory ARREST Use medication by any route, position, wound care, and other measures to relive pain and suffering. May use oxygen, suction and manual treatment of airway obstruction as needed for comfort.      09/21/16 1339    Code Status History    Date Active Date Inactive Code Status Order ID Comments User Context   This patient has a current code status but no historical code status.    Advance Directive Documentation     Most Recent Value  Type of Advance Directive  Healthcare Power of Attorney, Living will  Pre-existing out of facility DNR order (yellow form or pink MOST form)  -  "MOST" Form in Place?  -      TOTAL TIME TAKING CARE OF THIS PATIENT: 40 minutes.    Katharina Caper M.D on 09/25/2016 at 2:49 PM  Between 7am to 6pm - Pager - [phone]  After 6pm go to [email or web address] - password [email or web address] [facility]  [facility]  Office  [phone]  CC: Primary care physician; Maple Hudson., MD

## 2016-10-28 ENCOUNTER — Telehealth: Payer: Self-pay | Admitting: Family Medicine

## 2016-10-28 NOTE — Telephone Encounter (Signed)
Please see below-aa 

## 2016-10-28 NOTE — Telephone Encounter (Signed)
Ok to do

## 2016-10-28 NOTE — Telephone Encounter (Signed)
Verbal order left on Larry Davidson's voicemail.

## 2016-10-28 NOTE — Telephone Encounter (Signed)
Debra with Hospice is requesting a verbal order to change Dr Sullivan LoneGilbert to the attending physician for Hospice care.  Pt is leave the Hospice Home and [facility].  [phone]/[id number]

## 2016-10-30 ENCOUNTER — Telehealth: Payer: Self-pay

## 2016-10-30 NOTE — Telephone Encounter (Signed)
Denny PeonErin, an RN with [facility] called to inform us that pt fell. Hospice is in the home with pt, and they informed Denny Peonrin that he has abrasions of left forehead, left eyebrow, left knee and left elbow. Hospice nurse cleaned the abrasions and left them open to drain/heal. They are also doing in home checks every 2 hours.

## 2016-11-12 ENCOUNTER — Other Ambulatory Visit: Payer: Self-pay

## 2016-11-12 DIAGNOSIS — I10 Essential (primary) hypertension: Secondary | ICD-10-CM

## 2016-11-12 MED ORDER — AMLODIPINE BESYLATE 5 MG PO TABS: 5.0000 mg | ORAL_TABLET | Freq: Every day | ORAL | 3 refills | Status: DC

## 2016-11-16 ENCOUNTER — Ambulatory Visit: Payer: PPO

## 2016-12-04 ENCOUNTER — Encounter: Payer: Self-pay | Admitting: Emergency Medicine

## 2016-12-04 ENCOUNTER — Emergency Department

## 2016-12-04 ENCOUNTER — Inpatient Hospital Stay
Admission: EM | Admit: 2016-12-04 | Discharge: 2016-12-07 | DRG: 871 | Disposition: A | Discharge status: Home / Self Care | Attending: Specialist | Admitting: Specialist

## 2016-12-04 ENCOUNTER — Other Ambulatory Visit: Payer: Self-pay | Admitting: Family Medicine

## 2016-12-04 DIAGNOSIS — Z87891 Personal history of nicotine dependence: Secondary | ICD-10-CM | POA: Diagnosis not present

## 2016-12-04 DIAGNOSIS — Z7902 Long term (current) use of antithrombotics/antiplatelets: Secondary | ICD-10-CM

## 2016-12-04 DIAGNOSIS — Y92128 Other place in nursing home as the place of occurrence of the external cause: Secondary | ICD-10-CM | POA: Diagnosis not present

## 2016-12-04 DIAGNOSIS — Z66 Do not resuscitate: Secondary | ICD-10-CM | POA: Diagnosis present

## 2016-12-04 DIAGNOSIS — N400 Enlarged prostate without lower urinary tract symptoms: Secondary | ICD-10-CM | POA: Diagnosis present

## 2016-12-04 DIAGNOSIS — W19XXXA Unspecified fall, initial encounter: Secondary | ICD-10-CM | POA: Diagnosis present

## 2016-12-04 DIAGNOSIS — G9341 Metabolic encephalopathy: Secondary | ICD-10-CM | POA: Diagnosis not present

## 2016-12-04 DIAGNOSIS — I10 Essential (primary) hypertension: Secondary | ICD-10-CM | POA: Diagnosis present

## 2016-12-04 DIAGNOSIS — B9620 Unspecified Escherichia coli [E. coli] as the cause of diseases classified elsewhere: Secondary | ICD-10-CM | POA: Diagnosis present

## 2016-12-04 DIAGNOSIS — F0390 Unspecified dementia without behavioral disturbance: Secondary | ICD-10-CM | POA: Diagnosis not present

## 2016-12-04 DIAGNOSIS — B965 Pseudomonas (aeruginosa) (mallei) (pseudomallei) as the cause of diseases classified elsewhere: Secondary | ICD-10-CM | POA: Diagnosis present

## 2016-12-04 DIAGNOSIS — S299XXA Unspecified injury of thorax, initial encounter: Secondary | ICD-10-CM | POA: Diagnosis not present

## 2016-12-04 DIAGNOSIS — Z8673 Personal history of transient ischemic attack (TIA), and cerebral infarction without residual deficits: Secondary | ICD-10-CM

## 2016-12-04 DIAGNOSIS — S0990XA Unspecified injury of head, initial encounter: Secondary | ICD-10-CM | POA: Diagnosis not present

## 2016-12-04 DIAGNOSIS — A419 Sepsis, unspecified organism: Secondary | ICD-10-CM | POA: Diagnosis not present

## 2016-12-04 DIAGNOSIS — R509 Fever, unspecified: Secondary | ICD-10-CM | POA: Diagnosis not present

## 2016-12-04 DIAGNOSIS — Z79899 Other long term (current) drug therapy: Secondary | ICD-10-CM | POA: Diagnosis not present

## 2016-12-04 DIAGNOSIS — L22 Diaper dermatitis: Secondary | ICD-10-CM | POA: Diagnosis present

## 2016-12-04 DIAGNOSIS — N390 Urinary tract infection, site not specified: Secondary | ICD-10-CM | POA: Diagnosis present

## 2016-12-04 DIAGNOSIS — S199XXA Unspecified injury of neck, initial encounter: Secondary | ICD-10-CM | POA: Diagnosis not present

## 2016-12-04 LAB — CBC
HEMATOCRIT: 36.8 % — AB (ref 40.0–52.0)
Hemoglobin: 12.4 g/dL — ABNORMAL LOW (ref 13.0–18.0)
MCH: 30.7 pg (ref 26.0–34.0)
MCHC: 33.7 g/dL (ref 32.0–36.0)
MCV: 91.1 fL (ref 80.0–100.0)
PLATELETS: 382 10*3/uL (ref 150–440)
RBC: 4.04 MIL/uL — ABNORMAL LOW (ref 4.40–5.90)
RDW: 15.2 % — AB (ref 11.5–14.5)
WBC: 17.6 10*3/uL — AB (ref 3.8–10.6)

## 2016-12-04 LAB — URINALYSIS, COMPLETE (UACMP) WITH MICROSCOPIC
BILIRUBIN URINE: NEGATIVE
Glucose, UA: NEGATIVE mg/dL
Ketones, ur: NEGATIVE mg/dL
Nitrite: NEGATIVE
PH: 5 (ref 5.0–8.0)
Protein, ur: 100 mg/dL — AB
Specific Gravity, Urine: 1.015 (ref 1.005–1.030)
Squamous Epithelial / LPF: NONE SEEN

## 2016-12-04 LAB — COMPREHENSIVE METABOLIC PANEL
ALT: 14 U/L — ABNORMAL LOW (ref 17–63)
AST: 23 U/L (ref 15–41)
Albumin: 2.6 g/dL — ABNORMAL LOW (ref 3.5–5.0)
Alkaline Phosphatase: 71 U/L (ref 38–126)
Anion gap: 8 (ref 5–15)
BILIRUBIN TOTAL: 0.5 mg/dL (ref 0.3–1.2)
BUN: 28 mg/dL — AB (ref 6–20)
CHLORIDE: 99 mmol/L — AB (ref 101–111)
CO2: 30 mmol/L (ref 22–32)
Calcium: 10 mg/dL (ref 8.9–10.3)
Creatinine, Ser: 1.24 mg/dL (ref 0.61–1.24)
GFR, EST AFRICAN AMERICAN: 57 mL/min — AB (ref >60)
GFR, EST NON AFRICAN AMERICAN: 49 mL/min — AB (ref >60)
Glucose, Bld: 125 mg/dL — ABNORMAL HIGH (ref 65–99)
POTASSIUM: 4.4 mmol/L (ref 3.5–5.1)
Sodium: 137 mmol/L (ref 135–145)
Total Protein: 7.2 g/dL (ref 6.5–8.1)

## 2016-12-04 LAB — LACTIC ACID, PLASMA: LACTIC ACID, VENOUS: 1.7 mmol/L (ref 0.5–1.9)

## 2016-12-04 LAB — TROPONIN I: TROPONIN I: 0.07 ng/mL — AB (ref <0.03)

## 2016-12-04 LAB — MRSA PCR SCREENING: MRSA by PCR: NEGATIVE

## 2016-12-04 MED ORDER — ACETAMINOPHEN 650 MG RE SUPP: 650.0000 mg | Freq: Four times a day (QID) | RECTAL | Status: DC | PRN

## 2016-12-04 MED ORDER — TRAMADOL HCL 50 MG PO TABS: 50.0000 mg | ORAL_TABLET | Freq: Four times a day (QID) | ORAL | Status: DC | PRN

## 2016-12-04 MED ORDER — MIRTAZAPINE 15 MG PO TABS
30.0000 mg | ORAL_TABLET | Freq: Every day | ORAL | Status: DC
  Administered 2016-12-06: 30 mg via ORAL
  Filled 2016-12-04 (×2): qty 2

## 2016-12-04 MED ORDER — HYDRALAZINE HCL 20 MG/ML IJ SOLN: 10.0000 mg | Freq: Four times a day (QID) | INTRAMUSCULAR | Status: DC | PRN

## 2016-12-04 MED ORDER — DEXTROSE 5 % IV SOLN
1.0000 g | INTRAVENOUS | Status: DC
  Administered 2016-12-04 – 2016-12-06 (×3): 1 g via INTRAVENOUS
  Filled 2016-12-04 (×4): qty 10

## 2016-12-04 MED ORDER — GERHARDT'S BUTT CREAM
TOPICAL_CREAM | Freq: Two times a day (BID) | CUTANEOUS | Status: DC
  Administered 2016-12-04 – 2016-12-07 (×6): via TOPICAL
  Filled 2016-12-04: qty 1

## 2016-12-04 MED ORDER — SODIUM CHLORIDE 0.9 % IV SOLN
INTRAVENOUS | Status: DC
  Administered 2016-12-04 – 2016-12-07 (×4): via INTRAVENOUS

## 2016-12-04 MED ORDER — VANCOMYCIN HCL IN DEXTROSE 1-5 GM/200ML-% IV SOLN
1000.0000 mg | Freq: Once | INTRAVENOUS | Status: AC
  Administered 2016-12-04: 1000 mg via INTRAVENOUS
  Filled 2016-12-04: qty 200

## 2016-12-04 MED ORDER — ONDANSETRON HCL 4 MG PO TABS: 4.0000 mg | ORAL_TABLET | Freq: Four times a day (QID) | ORAL | Status: DC | PRN

## 2016-12-04 MED ORDER — SODIUM CHLORIDE 0.9 % IV BOLUS (SEPSIS)
1000.0000 mL | Freq: Once | INTRAVENOUS | Status: AC
  Administered 2016-12-04: 1000 mL via INTRAVENOUS

## 2016-12-04 MED ORDER — ACETAMINOPHEN 325 MG PO TABS: 650.0000 mg | ORAL_TABLET | Freq: Four times a day (QID) | ORAL | Status: DC | PRN

## 2016-12-04 MED ORDER — AMLODIPINE BESYLATE 5 MG PO TABS
5.0000 mg | ORAL_TABLET | Freq: Every day | ORAL | Status: DC
  Administered 2016-12-06: 5 mg via ORAL
  Filled 2016-12-04: qty 1

## 2016-12-04 MED ORDER — SENNOSIDES-DOCUSATE SODIUM 8.6-50 MG PO TABS: 1.0000 | ORAL_TABLET | Freq: Every evening | ORAL | Status: DC | PRN

## 2016-12-04 MED ORDER — PIPERACILLIN-TAZOBACTAM 3.375 G IVPB 30 MIN
3.3750 g | Freq: Once | INTRAVENOUS | Status: AC
  Administered 2016-12-04: 3.375 g via INTRAVENOUS
  Filled 2016-12-04: qty 50

## 2016-12-04 MED ORDER — CLOPIDOGREL BISULFATE 75 MG PO TABS
75.0000 mg | ORAL_TABLET | Freq: Every day | ORAL | Status: DC
  Administered 2016-12-05 – 2016-12-06 (×2): 75 mg via ORAL
  Filled 2016-12-04 (×3): qty 1

## 2016-12-04 MED ORDER — BISACODYL 5 MG PO TBEC: 5.0000 mg | DELAYED_RELEASE_TABLET | Freq: Every day | ORAL | Status: DC | PRN

## 2016-12-04 MED ORDER — HALOPERIDOL 0.5 MG PO TABS
0.5000 mg | ORAL_TABLET | Freq: Four times a day (QID) | ORAL | Status: DC | PRN
  Filled 2016-12-04: qty 1

## 2016-12-04 MED ORDER — TAMSULOSIN HCL 0.4 MG PO CAPS
0.4000 mg | ORAL_CAPSULE | Freq: Every day | ORAL | Status: DC
  Administered 2016-12-05 – 2016-12-06 (×2): 0.4 mg via ORAL
  Filled 2016-12-04 (×3): qty 1

## 2016-12-04 MED ORDER — ZINC OXIDE 20 % EX OINT
1.0000 "application " | TOPICAL_OINTMENT | CUTANEOUS | Status: DC | PRN
  Filled 2016-12-04: qty 28.35

## 2016-12-04 MED ORDER — ENOXAPARIN SODIUM 40 MG/0.4ML ~~LOC~~ SOLN
40.0000 mg | SUBCUTANEOUS | Status: DC
  Administered 2016-12-04 – 2016-12-06 (×3): 40 mg via SUBCUTANEOUS
  Filled 2016-12-04 (×3): qty 0.4

## 2016-12-04 MED ORDER — DONEPEZIL HCL 5 MG PO TABS
10.0000 mg | ORAL_TABLET | Freq: Every day | ORAL | Status: DC
  Administered 2016-12-05 – 2016-12-06 (×2): 10 mg via ORAL
  Filled 2016-12-04 (×4): qty 2

## 2016-12-04 MED ORDER — ONDANSETRON HCL 4 MG/2ML IJ SOLN: 4.0000 mg | Freq: Four times a day (QID) | INTRAMUSCULAR | Status: DC | PRN

## 2016-12-04 NOTE — Consult Note (Signed)
WOC Nurse wound consult note Reason for Consult:Moisure associated skin damage (incontinence) Wound type:MASD Pressure Injury POA:NA Measurement: generalized erythema and denuded skin to bilateral buttocks in gluteal fold.  Wound WUJ:WJXBbed:pink and moist Drainage (amount, consistency, odor) scant weeping Periwound:intact Dressing procedure/placement/frequency:Cleanse buttocks with soap and water.  Apply Gerhardt's cream.  This compound of hydrocortisone, nystatin and zinc oxide will provide moisture barrier and promote healing.  Will not follow at this time.  Please re-consult if needed.  Maple HudsonKaren Darroll Bredeson RN BSN CWON Pager [phone]

## 2016-12-04 NOTE — ED Triage Notes (Signed)
[facility], reports to [facility] ED [facility] with c/o fall. Staff reports AMS. Foley in place from facility. Skin tears noted to left shoulder. Alert to person only.

## 2016-12-04 NOTE — ED Provider Notes (Signed)
[facility] Emergency Department Provider Note  Time seen: 3:55 PM  I have reviewed the triage vital signs and the nursing notes.   HISTORY  Chief Complaint Fall    HPI Winona LegatoHarold E Eppinger is a 83 y.o. male With a past medical history of dementia, hypertension, CVA was found down at his nursing facility and sent for evaluation. According to EMS report the patient was found in the [place] area of the nursing facility on the ground. EMS states no obvious signs of trauma. Here the patient does not recall falling, is not sure why he is here but has a history of dementia. EMS did report staff states he is possibly more confused than normal but is confused at baseline.  Past Medical History:  Diagnosis Date  . Arthritis   . Dementia   . Hypertension   . Stroke [facility](HCC)     Patient Active Problem List   Diagnosis Date Noted  . Bladder outlet obstruction 09/25/2016  . Failure to thrive in adult 09/25/2016  . Lethargy   . Urinary tract infection associated with catheterization of urinary tract, initial encounter (HCC) 09/24/2016  . Sepsis due to Escherichia coli (E. coli) (HCC) 09/24/2016  . Acute renal insufficiency 09/24/2016  . Demand ischemia (HCC) 09/24/2016  . Lactic acidosis 09/24/2016  . Leukocytosis 09/24/2016  . Episode of unresponsiveness 09/24/2016  . Hypokalemia 09/24/2016  . Generalized weakness 09/24/2016  . Acute urinary retention 09/24/2016  . Goals of care, counseling/discussion   . Palliative care encounter   . Sepsis (HCC) 09/21/2016  . Hydrocele, right 09/01/2016  . Urinary retention 08/31/2016  . Hypertension 11/06/2015  . Dementia 11/06/2015  . Hyperlipidemia 11/06/2015    History reviewed. No pertinent surgical history.  Prior to Admission medications   Medication Sig Start Date End Date Taking? Authorizing Provider  amLODipine (NORVASC) 5 MG tablet Take 1 tablet (5 mg total) by mouth at bedtime. 11/12/16   Maple HudsonGilbert, Richard L Jr.,  MD  cephALEXin (KEFLEX) 500 MG capsule Take 1 capsule (500 mg total) by mouth every 12 (twelve) hours. 09/24/16   Katharina CaperVaickute, Rima, MD  Cholecalciferol (VITAMIN D-1000 MAX ST) 1000 units tablet Take by mouth.    [provider]  clopidogrel (PLAVIX) 75 MG tablet TAKE 1 TABLET DAILY 12/04/16   Maple HudsonGilbert, Richard L Jr., MD  donepezil (ARICEPT) 10 MG tablet TAKE 1 TABLET DAILY 12/10/15   Maple HudsonGilbert, Richard L Jr., MD  finasteride (PROSCAR) 5 MG tablet Take 1 tablet (5 mg total) by mouth daily. 09/01/16 09/01/17  Sebastian AcheManny, Theodore, MD  mirtazapine (REMERON) 30 MG tablet Take 1 tablet (30 mg total) by mouth at bedtime. 11/12/15   Maple HudsonGilbert, Richard L Jr., MD  tamsulosin (FLOMAX) 0.4 MG CAPS capsule Take 1 capsule (0.4 mg total) by mouth daily. 09/01/16   Sebastian AcheManny, Theodore, MD    No Known Allergies  Family History  Problem Relation Age of Onset  . Prostate cancer Neg Hx   . Bladder Cancer Neg Hx   . Kidney cancer Neg Hx     Social History Social History  Substance Use Topics  . Smoking status: Former Smoker    Quit date: 03/30/1951  . Smokeless tobacco: Never Used  . Alcohol use No    Review of Systems unable to obtain review of systems due to significant dementia at baseline  ____________________________________________   PHYSICAL EXAM:  Constitutional: alert, lying in bed watching TV, well appearing and in no distress Eyes: Normal exam ENT   Head: Normocephalic  and atraumatic.   Mouth/Throat: Mucous membranes are moist. Cardiovascular: Normal rate, regular rhythm Respiratory: Normal respiratory effort without tachypnea nor retractions. Breath sounds are clear  Gastrointestinal: Soft and nontender. No distention.  chronic indwelling Foley catheter Musculoskeletal: Nontender with normal range of motion in all extremities. No lower extremity tenderness or edema.range of motion in all extremities including upper and lower extremities, no obvious see or T-spine tenderness. Neurologic:   Normal speech and language. No gross focal neurologic deficits  Skin:  Skin is warm, dry and intact.  Psychiatric: Mood and affect are normal.   ____________________________________________    EKG  EKG reviewed and interpreted by myself shows sinus tachycardia 104 bpm, narrow QRS, normal axis, normal intervals with nonspecific ST changes.  ____________________________________________    RADIOLOGY  CTs are negative. Chest x-ray is negative  ____________________________________________   INITIAL IMPRESSION / ASSESSMENT AND PLAN / ED COURSE  Pertinent labs & imaging results that were available during my care of the patient were reviewed by me and considered in my medical decision making (see chart for details).  patient presents to the emergency department after a fall at his nursing facility, this was unwitnessed. Overall patient appears well, has baseline dementia with confusion at baseline per EMS report. Given the unwitnessed fall we'll obtain a CT scan the head and neck, check lab work including urinalysis although the patient does have a chronic indwelling Foley catheter.  I initially saw the patient upon EMS arrival. Vitals have now been obtained the patient is febrile to 101.1 with a heart rate of 110 bpm. Given a fall with fever and tachycardia I will order sepsis protocols and start broad-spectrum antibiotics.  labs show significant leukocytosis of 17,000, urine is grossly infected. We will continue IV antibiotics and admitted to the hospital for further treatment.  CRITICAL CARE Performed by: Minna Antis   Total critical care time: 30 minutes  Critical care time was exclusive of separately billable procedures and treating other patients.  Critical care was necessary to treat or prevent imminent or life-threatening deterioration.  Critical care was time spent personally by me on the following activities: development of treatment plan with patient and/or  surrogate as well as nursing, discussions with consultants, evaluation of patient's response to treatment, examination of patient, obtaining history from patient or surrogate, ordering and performing treatments and interventions, ordering and review of laboratory studies, ordering and review of radiographic studies, pulse oximetry and re-evaluation of patient's condition.   ____________________________________________   FINAL CLINICAL IMPRESSION(S) / ED DIAGNOSES  fall sepsis Fever   Minna Antis, MD 12/04/16 1743

## 2016-12-04 NOTE — ED Notes (Signed)
Attempted to call patient's son. No answer.

## 2016-12-04 NOTE — ED Notes (Signed)
Skylar, RN charge on 1C states to come up and give bedside report.

## 2016-12-04 NOTE — ED Notes (Signed)
CODE SEPSIS CALLED TO TARYN AT [facility] 

## 2016-12-04 NOTE — H&P (Signed)
[facility] - [place] [facility]   PATIENT NAME: Larry Davidson    MR#:  [id number]  DATE OF BIRTH:  February 13, 1924  DATE OF ADMISSION:  12/04/2016  PRIMARY CARE PHYSICIAN: Maple HudsonGilbert, Richard L Jr., MD   REQUESTING/REFERRING PHYSICIAN: Dr. Demetrius CharityP  CHIEF COMPLAINT:   Fall HISTORY OF PRESENT ILLNESS:  Larry MallHarold Nedrow  is a 83 y.o. male with a known history of Dementia and hypertension currently not on any medications who is from home place and presents after a fall. Staff also reports that they feel the patient is more confused than usual. Patient does have dementia and I am unable to obtain history of present illness or review of systems due to dementia. He is able to move all extremity is. CT the head was negative for intracranial hemorrhage. Patient presents with fever in addition to have noted leukocytosis on lab work. Patient has an indwelling Foley catheter with foul-smelling urine. Chest x-ray is clear PAST MEDICAL HISTORY:   Past Medical History:  Diagnosis Date  . Arthritis   . Dementia   . Hypertension   . Stroke [facility](HCC)     PAST SURGICAL HISTORY:  History reviewed. No pertinent surgical history.  SOCIAL HISTORY:   Social History  Substance Use Topics  . Smoking status: Former Smoker    Quit date: 03/30/1951  . Smokeless tobacco: Never Used  . Alcohol use No    FAMILY HISTORY:   Family History  Problem Relation Age of Onset  . Prostate cancer Neg Hx   . Bladder Cancer Neg Hx   . Kidney cancer Neg Hx     DRUG ALLERGIES:  No Known Allergies  REVIEW OF SYSTEMS:   Review of Systems  Unable to perform ROS: Dementia    MEDICATIONS AT HOME:   Prior to Admission medications   Medication Sig Start Date End Date Taking? Authorizing Provider  acetaminophen (TYLENOL) 325 MG tablet Take 650 mg by mouth every 4 (four) hours as needed.    [provider]                                            haloperidol (HALDOL) 0.5 MG tablet Take  0.5 mg by mouth every 6 (six) hours as needed for agitation.    [provider]         Skin Protectants, Misc. (ENDIT EX) Apply 1 application topically 3 (three) times daily as needed.    [provider]         zinc oxide 20 % ointment Apply 1 application topically as needed for irritation.    [provider]      VITAL SIGNS:  Blood pressure 139/65, pulse 86, temperature 98.8 F (37.1 C), temperature source Oral, resp. rate (!) 29, weight 54.4 kg (120 lb), SpO2 100 %.  PHYSICAL EXAMINATION:   Physical Exam  Constitutional: No distress.  Thin frail  HENT:  Head: Normocephalic.  Eyes: No scleral icterus.  Neck: Normal range of motion. Neck supple. No JVD present. No tracheal deviation present.  Cardiovascular: Normal rate, regular rhythm and normal heart sounds.  Exam reveals no gallop and no friction rub.   No murmur heard. Pulmonary/Chest: Effort normal and breath sounds normal. No respiratory distress. He has no wheezes. He has no rales. He exhibits no tenderness.  Abdominal: Soft. Bowel sounds are normal. He exhibits no distension and  no mass. There is no tenderness. There is no rebound and no guarding.  Foley catheter placed  Musculoskeletal: Normal range of motion. He exhibits no edema.  Neurological: He is alert.  Oriented to name  Skin: Skin is warm. Rash noted. No erythema.  Diaper rash      LABORATORY PANEL:   CBC  Recent Labs Lab 12/04/16 1628  WBC 17.6*  HGB 12.4*  HCT 36.8*  PLT 382   ------------------------------------------------------------------------------------------------------------------  Chemistries   Recent Labs Lab 12/04/16 1628  NA 137  K 4.4  CL 99*  CO2 30  GLUCOSE 125*  BUN 28*  CREATININE 1.24  CALCIUM 10.0  AST 23  ALT 14*  ALKPHOS 71  BILITOT 0.5   ------------------------------------------------------------------------------------------------------------------  Cardiac Enzymes  Recent  Labs Lab 12/04/16 1628  TROPONINI 0.07*   ------------------------------------------------------------------------------------------------------------------  RADIOLOGY:  Ct Head Wo Contrast  Result Date: 12/04/2016 CLINICAL DATA:  Head injury after fall. EXAM: CT HEAD WITHOUT CONTRAST CT CERVICAL SPINE WITHOUT CONTRAST TECHNIQUE: Multidetector CT imaging of the head and cervical spine was performed following the standard protocol without intravenous contrast. Multiplanar CT image reconstructions of the cervical spine were also generated. COMPARISON:  CT scan of September 23, 2016. FINDINGS: CT HEAD FINDINGS Brain: Mild diffuse cortical atrophy is noted. Mild chronic ischemic white matter disease is noted. Old left cerebellar infarction is noted. No mass effect or midline shift is noted. Ventricular size is within normal limits. There is no evidence of mass lesion, hemorrhage or acute infarction. Vascular: No hyperdense vessel or unexpected calcification. Skull: Normal. Negative for fracture or focal lesion. Sinuses/Orbits: Bilateral maxillary sinusitis. Other: None. CT CERVICAL SPINE FINDINGS Alignment: Normal. Skull base and vertebrae: No acute fracture. No primary bone lesion or focal pathologic process. Soft tissues and spinal canal: No prevertebral fluid or swelling. No visible canal hematoma. Disc levels: Severe degenerative disc disease is noted at C5-6 and C6-7 with anterior osteophyte formation. Upper chest: Negative. Other: None IMPRESSION: Mild diffuse cortical atrophy. Mild chronic ischemic white matter disease. No acute intracranial abnormality seen. Multilevel degenerative disc disease. No acute abnormality seen in the cervical spine. Electronically Signed   By: Lupita Raider, M.D.   On: 12/04/2016 16:36   Ct Cervical Spine Wo Contrast  Result Date: 12/04/2016 CLINICAL DATA:  Head injury after fall. EXAM: CT HEAD WITHOUT CONTRAST CT CERVICAL SPINE WITHOUT CONTRAST TECHNIQUE: Multidetector CT  imaging of the head and cervical spine was performed following the standard protocol without intravenous contrast. Multiplanar CT image reconstructions of the cervical spine were also generated. COMPARISON:  CT scan of September 23, 2016. FINDINGS: CT HEAD FINDINGS Brain: Mild diffuse cortical atrophy is noted. Mild chronic ischemic white matter disease is noted. Old left cerebellar infarction is noted. No mass effect or midline shift is noted. Ventricular size is within normal limits. There is no evidence of mass lesion, hemorrhage or acute infarction. Vascular: No hyperdense vessel or unexpected calcification. Skull: Normal. Negative for fracture or focal lesion. Sinuses/Orbits: Bilateral maxillary sinusitis. Other: None. CT CERVICAL SPINE FINDINGS Alignment: Normal. Skull base and vertebrae: No acute fracture. No primary bone lesion or focal pathologic process. Soft tissues and spinal canal: No prevertebral fluid or swelling. No visible canal hematoma. Disc levels: Severe degenerative disc disease is noted at C5-6 and C6-7 with anterior osteophyte formation. Upper chest: Negative. Other: None IMPRESSION: Mild diffuse cortical atrophy. Mild chronic ischemic white matter disease. No acute intracranial abnormality seen. Multilevel degenerative disc disease. No acute abnormality seen in the cervical  spine. Electronically Signed   By: Lupita Raider, M.D.   On: 12/04/2016 16:36   Dg Chest Port 1 View  Result Date: 12/04/2016 CLINICAL DATA:  Fall history of hypertension EXAM: PORTABLE CHEST 1 VIEW COMPARISON:  09/21/2016 FINDINGS: Hyperinflation. No acute pulmonary infiltrate, pleural effusion, or pneumothorax. The cardiomediastinal silhouette is within normal limits. Aortic atherosclerosis. IMPRESSION: No active disease. Electronically Signed   By: Jasmine Pang M.D.   On: 12/04/2016 16:32    EKG:  Sinus tachycardia heart rate 104 no ST elevation or depression  IMPRESSION AND PLAN:   83 year old male with  severe dementia and history of essential hypertension currently not on any medications who presents after a fall and confusion.  1. Acute metabolic encephalopathy on chronic dementia: Encephalopathy is due to urinary tract infection with sepsis  2. Sepsis due to Urinary tract infection: Patient presents with fever and leukocytosis  Start Rocephin and follow-up on final urine culture and blood cultures.  3 Dementia: Currently not on any medications  4. Diaper rash: Continue diaper rash cream  5. Essential hypertension: Patient is not on any medications at home but will restart Norvasc at this time due to elevated blood pressure  PT and clinical social worker consultation requested for discharge planning  All the records are reviewed and case discussed with ED provider.  I contacted patient's son, Lorin Picket (POA)   CODE STATUS: DNR  He has hospice services  TOTAL TIME TAKING CARE OF THIS PATIENT: 48 minutes.    Shuaib Corsino M.D on 12/04/2016 at 6:04 PM  Between 7am to 6pm - Pager - [phone]  After 6pm go to [email or web address] - Social research officer, government  [facility]  Office  [phone]  CC: Primary care physician; Maple Hudson., MD

## 2016-12-04 NOTE — ED Notes (Addendum)
Yellow socks placed on patient. Bed alarm in place as well.

## 2016-12-04 NOTE — ED Notes (Signed)
Patient transported to CT at this time. 

## 2016-12-04 NOTE — Progress Notes (Signed)
Son, Lorin PicketScott came and was able to answer profile questions on patient.  He said the foley has been in around 4 months.  The patient is impulsive and he has requested that all four bed rails be up.  If emergency arises, call Lorin PicketScott first, Joy second.

## 2016-12-04 NOTE — Progress Notes (Signed)
ANTIBIOTIC CONSULT NOTE - INITIAL  Pharmacy Consult for Rocephin Indication: UTI  No Known Allergies  Patient Measurements: Weight: 120 lb (54.4 kg) Adjusted Body Weight:   Vital Signs: Temp: 98.2 F (36.8 C) (09/07 1845) Temp Source: Oral (09/07 1845) BP: 130/56 (09/07 1845) Pulse Rate: 84 (09/07 1845) Intake/Output from previous day: No intake/output data recorded. Intake/Output from this shift: No intake/output data recorded.  Labs:  Recent Labs  12/04/16 1628  WBC 17.6*  HGB 12.4*  PLT 382  CREATININE 1.24   Estimated Creatinine Clearance: 30.5 mL/min (by C-G formula based on SCr of 1.24 mg/dL). No results for input(s): VANCOTROUGH, VANCOPEAK, VANCORANDOM, GENTTROUGH, GENTPEAK, GENTRANDOM, TOBRATROUGH, TOBRAPEAK, TOBRARND, AMIKACINPEAK, AMIKACINTROU, AMIKACIN in the last 72 hours.   Microbiology: No results found for this or any previous visit (from the past 720 hour(s)).  Medical History: Past Medical History:  Diagnosis Date  . Arthritis   . Dementia   . Hypertension   . Stroke [facility](HCC)     Medications:  Prescriptions Prior to Admission  Medication Sig Dispense Refill Last Dose  . acetaminophen (TYLENOL) 325 MG tablet Take 650 mg by mouth every 4 (four) hours as needed.   prn at prn  . amLODipine (NORVASC) 5 MG tablet Take 1 tablet (5 mg total) by mouth at bedtime. (Patient not taking: Reported on 12/04/2016) 90 tablet 3 Not Taking at Unknown time  . cephALEXin (KEFLEX) 500 MG capsule Take 1 capsule (500 mg total) by mouth every 12 (twelve) hours. (Patient not taking: Reported on 12/04/2016) 24 capsule 0 Not Taking at Unknown time  . Cholecalciferol (VITAMIN D-1000 MAX ST) 1000 units tablet Take by mouth.   Not Taking at Unknown time  . clopidogrel (PLAVIX) 75 MG tablet TAKE 1 TABLET DAILY (Patient not taking: Reported on 12/04/2016) 90 tablet 3 Not Taking at Unknown time  . donepezil (ARICEPT) 10 MG tablet TAKE 1 TABLET DAILY (Patient not taking: Reported on  12/04/2016) 90 tablet 3 Not Taking at Unknown time  . finasteride (PROSCAR) 5 MG tablet Take 1 tablet (5 mg total) by mouth daily. (Patient not taking: Reported on 12/04/2016) 90 tablet 3 Not Taking at Unknown time  . haloperidol (HALDOL) 0.5 MG tablet Take 0.5 mg by mouth every 6 (six) hours as needed for agitation.   prn at prn  . mirtazapine (REMERON) 30 MG tablet Take 1 tablet (30 mg total) by mouth at bedtime. (Patient not taking: Reported on 12/04/2016) 90 tablet 1 Not Taking at Unknown time  . Skin Protectants, Misc. (ENDIT EX) Apply 1 application topically 3 (three) times daily as needed.   prn at prn  . tamsulosin (FLOMAX) 0.4 MG CAPS capsule Take 1 capsule (0.4 mg total) by mouth daily. (Patient not taking: Reported on 12/04/2016) 90 capsule 3 Not Taking at Unknown time  . zinc oxide 20 % ointment Apply 1 application topically as needed for irritation.   prn at prn   Scheduled:  . clopidogrel  75 mg Oral Daily  . donepezil  10 mg Oral Daily  . Gerhardt's butt cream   Topical BID  . mirtazapine  30 mg Oral QHS  . tamsulosin  0.4 mg Oral Daily   Assessment: Pharmacy consulted to dose rocephin in this 83 year old man being treated for urinary tract infection.   Goal of Therapy:    Plan:  Will start rocephin 1 g IV q24 hours   Kamali Nephew D 12/04/2016,7:01 PM

## 2016-12-04 NOTE — ED Notes (Signed)
Unable to obtain blood at this time. Lab called and notified. States they will come draw.

## 2016-12-04 NOTE — ED Notes (Signed)
Zosyn not available. Pharmacy called and notified. States they will tube it up. Verbal order from Dr. Lenard LancePaduchowski to go ahead and give Vanc.

## 2016-12-05 LAB — CBC
HCT: 32.5 % — ABNORMAL LOW (ref 40.0–52.0)
Hemoglobin: 10.9 g/dL — ABNORMAL LOW (ref 13.0–18.0)
MCH: 30.8 pg (ref 26.0–34.0)
MCHC: 33.6 g/dL (ref 32.0–36.0)
MCV: 91.7 fL (ref 80.0–100.0)
PLATELETS: 310 10*3/uL (ref 150–440)
RBC: 3.54 MIL/uL — ABNORMAL LOW (ref 4.40–5.90)
RDW: 15.1 % — AB (ref 11.5–14.5)
WBC: 13.8 10*3/uL — AB (ref 3.8–10.6)

## 2016-12-05 LAB — BASIC METABOLIC PANEL
Anion gap: 5 (ref 5–15)
BUN: 22 mg/dL — AB (ref 6–20)
CALCIUM: 9.2 mg/dL (ref 8.9–10.3)
CO2: 27 mmol/L (ref 22–32)
CREATININE: 1.08 mg/dL (ref 0.61–1.24)
Chloride: 107 mmol/L (ref 101–111)
GFR calc Af Amer: >60 mL/min (ref >60)
GFR, EST NON AFRICAN AMERICAN: 58 mL/min — AB (ref >60)
GLUCOSE: 100 mg/dL — AB (ref 65–99)
Potassium: 3.7 mmol/L (ref 3.5–5.1)
SODIUM: 139 mmol/L (ref 135–145)

## 2016-12-05 MED ORDER — ORAL CARE MOUTH RINSE
15.0000 mL | Freq: Two times a day (BID) | OROMUCOSAL | Status: DC
  Administered 2016-12-05 – 2016-12-07 (×4): 15 mL via OROMUCOSAL

## 2016-12-05 NOTE — [place] FL2 (Signed)
[place] MEDICAID FL2 LEVEL OF CARE SCREENING TOOL     IDENTIFICATION  Patient Name: Larry Davidson Birthdate: 1923-06-17 Sex: male Admission Date (Current Location): 12/04/2016  [place] and [place] Number:  Chiropodist and Address:  [facility], [address] [place], [place] [address]      Provider Number: [id number]  Attending Physician Name and Address:  Houston Siren, MD  Relative Name and Phone Number:  Fate Caster (son) [phone]    Current Level of Care: Hospital Recommended Level of Care: Assisted Living Facility Prior Approval Number:    Date Approved/Denied:   PASRR Number:    Discharge Plan: Domiciliary (Rest home)    Current Diagnoses: Patient Active Problem List   Diagnosis Date Noted  . Bladder outlet obstruction 09/25/2016  . Failure to thrive in adult 09/25/2016  . Lethargy   . Urinary tract infection associated with catheterization of urinary tract, initial encounter (HCC) 09/24/2016  . Sepsis due to Escherichia coli (E. coli) (HCC) 09/24/2016  . Acute renal insufficiency 09/24/2016  . Demand ischemia (HCC) 09/24/2016  . Lactic acidosis 09/24/2016  . Leukocytosis 09/24/2016  . Episode of unresponsiveness 09/24/2016  . Hypokalemia 09/24/2016  . Generalized weakness 09/24/2016  . Acute urinary retention 09/24/2016  . Goals of care, counseling/discussion   . Palliative care encounter   . Sepsis (HCC) 09/21/2016  . Hydrocele, right 09/01/2016  . Urinary retention 08/31/2016  . Hypertension 11/06/2015  . Dementia 11/06/2015  . Hyperlipidemia 11/06/2015    Orientation RESPIRATION BLADDER Height & Weight     Self  Normal Indwelling catheter Weight: 108 lb 6.4 oz (49.2 kg) Height:   (170.2 cm)  BEHAVIORAL SYMPTOMS/MOOD NEUROLOGICAL BOWEL NUTRITION STATUS      Continent    AMBULATORY STATUS COMMUNICATION OF NEEDS Skin   Total Care Verbally Skin abrasions (generalized erythema and denuded  skin to bilateral buttocks in gluteal fold)                       Personal Care Assistance Level of Assistance  Bathing, Feeding, Dressing Bathing Assistance: Maximum assistance Feeding assistance: Limited assistance Dressing Assistance: Maximum assistance     Functional Limitations Info             SPECIAL CARE FACTORS FREQUENCY                       Contractures Contractures Info: Not present    Additional Factors Info  Code Status, Allergies, Psychotropic Code Status Info: DNR Allergies Info: NKA Psychotropic Info: Aricept, Remeron         Current Medications (12/05/2016):  This is the current hospital active medication list Current Facility-Administered Medications  Medication Dose Route Frequency Provider Last Rate Last Dose  . 0.9 %  sodium chloride infusion   Intravenous Continuous Adrian Saran, MD 50 mL/hr at 12/04/16 2014    . acetaminophen (TYLENOL) tablet 650 mg  650 mg Oral Q6H PRN Adrian Saran, MD       Or  . acetaminophen (TYLENOL) suppository 650 mg  650 mg Rectal Q6H PRN Mody, Sital, MD      . amLODipine (NORVASC) tablet 5 mg  5 mg Oral QHS Mody, Sital, MD      . bisacodyl (DULCOLAX) EC tablet 5 mg  5 mg Oral Daily PRN Mody, Sital, MD      . cefTRIAXone (ROCEPHIN) 1 g in dextrose 5 % 50 mL IVPB  1 g  Intravenous Q24H Adrian SaranMody, Sital, MD   Stopped at 12/04/16 2047  . clopidogrel (PLAVIX) tablet 75 mg  75 mg Oral Daily Adrian SaranMody, Sital, MD   75 mg at 12/05/16 1100  . donepezil (ARICEPT) tablet 10 mg  10 mg Oral Daily Adrian SaranMody, Sital, MD   10 mg at 12/05/16 1100  . enoxaparin (LOVENOX) injection 40 mg  40 mg Subcutaneous Q24H Adrian SaranMody, Sital, MD   40 mg at 12/04/16 2029  . Gerhardt's butt cream   Topical BID Adrian SaranMody, Sital, MD      . haloperidol (HALDOL) tablet 0.5 mg  0.5 mg Oral Q6H PRN Juliene PinaMody, Sital, MD      . hydrALAZINE (APRESOLINE) injection 10 mg  10 mg Intravenous Q6H PRN Mody, Sital, MD      . mirtazapine (REMERON) tablet 30 mg  30 mg Oral QHS Mody, Sital, MD       . ondansetron (ZOFRAN) tablet 4 mg  4 mg Oral Q6H PRN Mody, Sital, MD       Or  . ondansetron (ZOFRAN) injection 4 mg  4 mg Intravenous Q6H PRN Mody, Sital, MD      . senna-docusate (Senokot-S) tablet 1 tablet  1 tablet Oral QHS PRN Mody, Sital, MD      . tamsulosin (FLOMAX) capsule 0.4 mg  0.4 mg Oral Daily Mody, Sital, MD   0.4 mg at 12/05/16 1101  . traMADol (ULTRAM) tablet 50 mg  50 mg Oral Q6H PRN Mody, Sital, MD      . zinc oxide 20 % ointment 1 application  1 application Topical PRN Adrian SaranMody, Sital, MD         Discharge Medications: Please see discharge summary for a list of discharge medications.  Medication List             STOP taking these medications            amLODipine 5 MG tablet Commonly known as:  NORVASC    cephALEXin 500 MG capsule Commonly known as:  KEFLEX    clopidogrel 75 MG tablet Commonly known as:  PLAVIX    donepezil 10 MG tablet Commonly known as:  ARICEPT    finasteride 5 MG tablet Commonly known as:  PROSCAR    mirtazapine 30 MG tablet Commonly known as:  REMERON    tamsulosin 0.4 MG Caps capsule Commonly known as:  FLOMAX                       TAKE these medications    acetaminophen 325 MG tablet Commonly known as:  TYLENOL Take 650 mg by mouth every 4 (four) hours as needed.    ciprofloxacin 250 MG tablet Commonly known as:  CIPRO Take 1 tablet (250 mg total) by mouth 2 (two) times daily.    ENDIT EX Apply 1 application topically 3 (three) times daily as needed.    haloperidol 0.5 MG tablet Commonly known as:  HALDOL Take 0.5 mg by mouth every 6 (six) hours as needed for agitation.    VITAMIN D-1000 MAX ST 1000 units tablet Generic drug:  Cholecalciferol Take by mouth.    zinc oxide 20 % ointment Apply 1 application topically as needed for irritation.                                         Discharge Care Instructions  Start     Ordered   12/07/16 0000  ciprofloxacin  (CIPRO) 250 MG tablet  2 times daily     12/07/16 1307   12/07/16 0000  Activity as tolerated - No restrictions     12/07/16 1307   12/07/16 0000        Relevant Imaging Results:  Relevant Lab Results:   Additional Information SS# [id number]  Judi Cong, LCSW

## 2016-12-05 NOTE — Progress Notes (Signed)
[facility] [place] [facility]   PATIENT NAME: Larry Davidson    MR#:  [id number]  DATE OF BIRTH:  30-May-1923  SUBJECTIVE:   Patient here after a fall and altered mental status and noted to have urinary tract infection. Remains somewhat lethargic and encephalopathic today.  REVIEW OF SYSTEMS:    Review of Systems  Unable to perform ROS: Dementia    Nutrition: Regular Tolerating Diet: Yes Tolerating PT: Await Eval.   DRUG ALLERGIES:  No Known Allergies  VITALS:  Blood pressure (!) 133/51, pulse 75, temperature 97.7 F (36.5 C), temperature source Oral, resp. rate 18, height 5\' 7"  (1.702 m), weight 49.2 kg (108 lb 6.4 oz), SpO2 98 %.  PHYSICAL EXAMINATION:   Physical Exam  GENERAL:  83 y.o.-year-old patient lying in bed lethargic/encephalopathic.  EYES: Pupils equal, round, reactive to light. No scleral icterus. Extraocular muscles intact.  HEENT: Head atraumatic, normocephalic. Oropharynx and nasopharynx clear.  NECK:  Supple, no jugular venous distention. No thyroid enlargement, no tenderness.  LUNGS: Normal breath sounds bilaterally, no wheezing, rales, rhonchi. No use of accessory muscles of respiration.  CARDIOVASCULAR: S1, S2 normal. No murmurs, rubs, or gallops.  ABDOMEN: Soft, nontender, nondistended. Bowel sounds present. No organomegaly or mass.  EXTREMITIES: No cyanosis, clubbing or edema b/l.    NEUROLOGIC: Cranial nerves II through XII are intact. No focal Motor or sensory deficits b/l. Globally weak  PSYCHIATRIC: The patient is alert and oriented x 1.  SKIN: No obvious rash, lesion, or ulcer.   Chronic Indwelling Foley in place with Yellow urine draining.    LABORATORY PANEL:   CBC  Recent Labs Lab 12/05/16 0358  WBC 13.8*  HGB 10.9*  HCT 32.5*  PLT 310   ------------------------------------------------------------------------------------------------------------------  Chemistries   Recent Labs Lab 12/04/16 1628  12/05/16 0358  NA 137 139  K 4.4 3.7  CL 99* 107  CO2 30 27  GLUCOSE 125* 100*  BUN 28* 22*  CREATININE 1.24 1.08  CALCIUM 10.0 9.2  AST 23  --   ALT 14*  --   ALKPHOS 71  --   BILITOT 0.5  --    ------------------------------------------------------------------------------------------------------------------  Cardiac Enzymes  Recent Labs Lab 12/04/16 1628  TROPONINI 0.07*   ------------------------------------------------------------------------------------------------------------------  RADIOLOGY:  Ct Head Wo Contrast  Result Date: 12/04/2016 CLINICAL DATA:  Head injury after fall. EXAM: CT HEAD WITHOUT CONTRAST CT CERVICAL SPINE WITHOUT CONTRAST TECHNIQUE: Multidetector CT imaging of the head and cervical spine was performed following the standard protocol without intravenous contrast. Multiplanar CT image reconstructions of the cervical spine were also generated. COMPARISON:  CT scan of September 23, 2016. FINDINGS: CT HEAD FINDINGS Brain: Mild diffuse cortical atrophy is noted. Mild chronic ischemic white matter disease is noted. Old left cerebellar infarction is noted. No mass effect or midline shift is noted. Ventricular size is within normal limits. There is no evidence of mass lesion, hemorrhage or acute infarction. Vascular: No hyperdense vessel or unexpected calcification. Skull: Normal. Negative for fracture or focal lesion. Sinuses/Orbits: Bilateral maxillary sinusitis. Other: None. CT CERVICAL SPINE FINDINGS Alignment: Normal. Skull base and vertebrae: No acute fracture. No primary bone lesion or focal pathologic process. Soft tissues and spinal canal: No prevertebral fluid or swelling. No visible canal hematoma. Disc levels: Severe degenerative disc disease is noted at C5-6 and C6-7 with anterior osteophyte formation. Upper chest: Negative. Other: None IMPRESSION: Mild diffuse cortical atrophy. Mild chronic ischemic white matter disease. No acute intracranial abnormality seen.  Multilevel degenerative disc  disease. No acute abnormality seen in the cervical spine. Electronically Signed   By: Lupita Raider, M.D.   On: 12/04/2016 16:36   Ct Cervical Spine Wo Contrast  Result Date: 12/04/2016 CLINICAL DATA:  Head injury after fall. EXAM: CT HEAD WITHOUT CONTRAST CT CERVICAL SPINE WITHOUT CONTRAST TECHNIQUE: Multidetector CT imaging of the head and cervical spine was performed following the standard protocol without intravenous contrast. Multiplanar CT image reconstructions of the cervical spine were also generated. COMPARISON:  CT scan of September 23, 2016. FINDINGS: CT HEAD FINDINGS Brain: Mild diffuse cortical atrophy is noted. Mild chronic ischemic white matter disease is noted. Old left cerebellar infarction is noted. No mass effect or midline shift is noted. Ventricular size is within normal limits. There is no evidence of mass lesion, hemorrhage or acute infarction. Vascular: No hyperdense vessel or unexpected calcification. Skull: Normal. Negative for fracture or focal lesion. Sinuses/Orbits: Bilateral maxillary sinusitis. Other: None. CT CERVICAL SPINE FINDINGS Alignment: Normal. Skull base and vertebrae: No acute fracture. No primary bone lesion or focal pathologic process. Soft tissues and spinal canal: No prevertebral fluid or swelling. No visible canal hematoma. Disc levels: Severe degenerative disc disease is noted at C5-6 and C6-7 with anterior osteophyte formation. Upper chest: Negative. Other: None IMPRESSION: Mild diffuse cortical atrophy. Mild chronic ischemic white matter disease. No acute intracranial abnormality seen. Multilevel degenerative disc disease. No acute abnormality seen in the cervical spine. Electronically Signed   By: Lupita Raider, M.D.   On: 12/04/2016 16:36   Dg Chest Port 1 View  Result Date: 12/04/2016 CLINICAL DATA:  Fall history of hypertension EXAM: PORTABLE CHEST 1 VIEW COMPARISON:  09/21/2016 FINDINGS: Hyperinflation. No acute pulmonary  infiltrate, pleural effusion, or pneumothorax. The cardiomediastinal silhouette is within normal limits. Aortic atherosclerosis. IMPRESSION: No active disease. Electronically Signed   By: Jasmine Pang M.D.   On: 12/04/2016 16:32     ASSESSMENT AND PLAN:   83 year old male with past medical history of dementia, hypertension, history of previous CVA, osteoarthritis of present the hospital after a fall and also noted to have altered mental status.  1. Altered mental status-metabolic encephalopathy secondary to UTI combined with underlying dementia. -Continue IV ceftriaxone for the UTI. Follow mental status. -CT scan of the head and cervical spine negative for acute pathology.  2. Urinary tract infection-continue IV ceftriaxone. Follow urine cultures. Patient's previous urine cultures are positive for Escherichia coli which was pansensitive.  3.history of previous CVA-continue Plavix.  4. Dementia-continue Aricept. - cont. Remeron.   5. Essential hypertension-continue Norvasc, as needed IV hydralazine.  6. BPH-continue Flomax.     All the records are reviewed and case discussed with Care Management/Social Worker. Management plans discussed with the patient, family and they are in agreement.  CODE STATUS: DNR  DVT Prophylaxis: Lovenox  TOTAL TIME TAKING CARE OF THIS PATIENT: 30 minutes.   POSSIBLE D/C IN 1-2 DAYS, DEPENDING ON CLINICAL CONDITION.   Houston Siren M.D on 12/05/2016 at 1:27 PM  Between 7am to 6pm - Pager - [phone]  After 6pm go to [email or web address] - Social research officer, government  [facility] [place]  Office  [phone]  CC: Primary care physician; Maple Hudson., MD

## 2016-12-05 NOTE — Progress Notes (Signed)
PT Attempt Note  Patient Details Name: Larry Davidson MRN: [id number] DOB: 06/14/1923   Evaluation Attempt:    Reason Eval/Treat Not Completed: Patient's level of consciousness. Order received and chart reviewed. Attempted to see patient however he is difficult to arouse this AM. Pt will occasionally open eyes and moan but mostly keeps his eyes closed. Pt laying on L side with knees to chest. Will not follow any commands for therapist. Unable to perform PT evaluation at this time. Will attempt at later time/date as pt is appropriate.  Sharalyn InkJason D Javarius Tsosie PT, DPT   Derisha Funderburke 12/05/2016, 9:45 AM

## 2016-12-05 NOTE — Clinical Social Work Note (Signed)
CSW received consult for possible SNF needs. The CSW is aware that the patient admitted from ALF and may be a candidate for hospice. CSW will assess when able.  Argentina PonderKaren Martha Valma Rotenberg, MSW, Theresia MajorsLCSWA [phone]

## 2016-12-05 NOTE — Clinical Social Work Note (Signed)
Clinical Social Work Assessment  Patient Details  Name: Larry Davidson MRN: [id number] Date of Birth: 08-18-1923  Date of referral:  12/05/16               Reason for consult:  Facility Placement                Permission sought to share information with:  Oceanographeracility Contact Representative Permission granted to share information::  Yes, Verbal Permission Granted  Name::        Agency::  [facility]  Relationship::  ALF  Contact Information:     Housing/Transportation Living arrangements for the past 2 months:  [facility] Source of Information:  Medical Team, Adult Children, Facility Patient Interpreter Needed:  None Criminal Activity/Legal Involvement Pertinent to Current Situation/Hospitalization:  No - Comment as needed Significant Relationships:  Adult Children, Community Support Lives with:  Facility Resident Do you feel safe going back to the place where you live?  Yes Need for family participation in patient care:  Yes (Comment) (Patient has dementia and AMS)  Care giving concerns:  Patient admitted from [facility] (ALF)   Social Worker assessment / plan: The CSW contacted the patient's son, Larry Davidson, to discuss discharge planning. Larry Davidson reported that the [facility] is a resident of [facility] and will return when stable. Scott gave verbal permission to contact the facility, and he stated that he has no interest in pursuing SNF placement as his father is WC bound at baseline. The patient also has had a Foley catheter for the past 4 months. Larry Davidson indicated that he will most likely transport his father at discharge if he is well enough to travel by car.  The facility med tech, Cordelia Penthel, confirmed that the patient can return tomorrow or Monday. The CSW will continue to follow to facilitate discharge.  Employment status:  Retired Database administratornsurance information:  Managed Medicare PT Recommendations:  No Follow Up Information / Referral to community  resources:     Patient/Family's Response to care:  The patient was too lethargic to discuss care. The patient's son thanked the CSW for assistance.  Patient/Family's Understanding of and Emotional Response to Diagnosis, Current Treatment, and Prognosis:  The patient has dementia and does not understand that he is in a hospital. The patient's son is aware of the diagnosis of UTI and his father's barriers for PT. The patient's son is in agreement with the patient returning to his facility.  Emotional Assessment Appearance:  Appears stated age Attitude/Demeanor/Rapport:  Lethargic Affect (typically observed):  Withdrawn Orientation:  Oriented to Self, Oriented to  Time Alcohol / Substance use:  Never Used Psych involvement (Current and /or in the community):  No (Comment)  Discharge Needs  Concerns to be addressed:  Care Coordination, Discharge Planning Concerns Readmission within the last 30 days:  No Current discharge risk:  Chronically ill Barriers to Discharge:  Continued Medical Work up   UAL CorporationKaren M Maci Eickholt, LCSW 12/05/2016, 3:53 PM

## 2016-12-05 NOTE — Plan of Care (Signed)
Problem: Education: Goal: Knowledge of [facility] Education information/materials will improve Outcome: Not Progressing Pt slept most of the shift. Alert to self. Follow commands.   Problem: Fluid Volume: Goal: Ability to maintain a balanced intake and output will improve Outcome: Not Progressing Poor PO intake. Pt did not eat breakfast nor lunch. Fluids encouraged. IVF infusing.  Problem: Nutrition: Goal: Adequate nutrition will be maintained Outcome: Not Progressing As above

## 2016-12-05 NOTE — Plan of Care (Signed)
Problem: Education: Goal: Knowledge of [name] Education information/materials will improve Outcome: Progressing Pt likes to be called Larry Davidson  Past Medical History:  Diagnosis Date  . Arthritis   . Dementia   . Hypertension   . Stroke [facility](HCC)      Pt is well controlled with home medications

## 2016-12-05 NOTE — Progress Notes (Signed)
PT Cancellation Note  Patient Details Name: Winona LegatoHarold E Delong MRN: [id number] DOB: 1923/06/01   Cancelled Treatment:    Reason Eval/Treat Not Completed: PT screened, no needs identified, will sign off. Order received and chart reviewed. Attempted evaluation earlier in AM however unable to arouse patient from sleep. Patient wakes up this afternoon with verbal and tactile stimuli. He is able to tell me his name, DOB, and that he is in [place] but is unaware that he is in the hospital. Disoriented to year and situation and conversation is not entirely appropriate. Follows approximately 20% of single-step commands. Spoke with son over the telephone who states that [facility] and being followed by Hospice. He mostly only performs transfers to/from wheelchair with staff due to impulsivity and falls. He has worked with physical therapy at facility but without any functional benefit. Son is not interested in any further PT services for patient. Due to dementia son states that he is unable to participate adequately with therapy and that he will not receive any functional improvement from therapy. They are not interested in suspending Hospice services for SNF placement. No further needs identified at this time. Will complete order. Please enter new order if PT evaluation is needed.  Sharalyn InkJason D Huprich PT, DPT   Huprich,Jason 12/05/2016, 3:35 PM

## 2016-12-06 LAB — CBC
HEMATOCRIT: 34.6 % — AB (ref 40.0–52.0)
HEMOGLOBIN: 11.6 g/dL — AB (ref 13.0–18.0)
MCH: 30.2 pg (ref 26.0–34.0)
MCHC: 33.5 g/dL (ref 32.0–36.0)
MCV: 90.1 fL (ref 80.0–100.0)
Platelets: 316 10*3/uL (ref 150–440)
RBC: 3.84 MIL/uL — ABNORMAL LOW (ref 4.40–5.90)
RDW: 14.7 % — AB (ref 11.5–14.5)
WBC: 12.3 10*3/uL — ABNORMAL HIGH (ref 3.8–10.6)

## 2016-12-06 MED ORDER — ADULT MULTIVITAMIN W/MINERALS CH
1.0000 | ORAL_TABLET | Freq: Every day | ORAL | Status: DC
  Filled 2016-12-06: qty 1

## 2016-12-06 MED ORDER — ENSURE ENLIVE PO LIQD: 237.0000 mL | Freq: Two times a day (BID) | ORAL | Status: DC

## 2016-12-06 NOTE — Progress Notes (Signed)
[employer] [facility] - [place] at [facility]   PATIENT NAME: Larry Davidson    MR#:  [id number]  DATE OF BIRTH:  [birth year]  SUBJECTIVE:   Patient here after a fall and altered mental status and noted to have urinary tract infection. patient is more awake today.no other acute events overnight.  REVIEW OF SYSTEMS:    Review of Systems  Unable to perform ROS: Dementia    Nutrition: Regular Tolerating Diet: little Tolerating PT: Await Eval.   DRUG ALLERGIES:  No Known Allergies  VITALS:  Blood pressure (!) 138/58, pulse 84, temperature 97.8 F (36.6 C), temperature source Oral, resp. rate (!) 21, height  (1.702 m), weight 49.2 kg (108 lb 6.4 oz), SpO2 96 %.  PHYSICAL EXAMINATION:   Physical Exam  GENERAL:  83 y.o.-year-old patient lying in bed sleepy but awakens to verbal commands.  EYES: Pupils equal, round, reactive to light. No scleral icterus. Extraocular muscles intact.  HEENT: Head atraumatic, normocephalic. Oropharynx and nasopharynx clear.  NECK:  Supple, no jugular venous distention. No thyroid enlargement, no tenderness.  LUNGS: Normal breath sounds bilaterally, no wheezing, rales, rhonchi. No use of accessory muscles of respiration.  CARDIOVASCULAR: S1, S2 normal. No murmurs, rubs, or gallops.  ABDOMEN: Soft, nontender, nondistended. Bowel sounds present. No organomegaly or mass.  EXTREMITIES: No cyanosis, clubbing or edema b/l.    NEUROLOGIC: Cranial nerves II through XII are intact. No focal Motor or sensory deficits b/l. Globally weak  PSYCHIATRIC: The patient is alert and oriented x 1.  SKIN: No obvious rash, lesion, or ulcer.   Chronic Indwelling Foley in place with Yellow urine draining.    LABORATORY PANEL:   CBC  Recent Labs Lab 12/06/16 0509  WBC 12.3*  HGB 11.6*  HCT 34.6*  PLT 316   ------------------------------------------------------------------------------------------------------------------  Chemistries   Recent  Labs Lab 12/04/16 1628 12/05/16 0358  NA 137 139  K 4.4 3.7  CL 99* 107  CO2 30 27  GLUCOSE 125* 100*  BUN 28* 22*  CREATININE 1.24 1.08  CALCIUM 10.0 9.2  AST 23  --   ALT 14*  --   ALKPHOS 71  --   BILITOT 0.5  --    ------------------------------------------------------------------------------------------------------------------  Cardiac Enzymes  Recent Labs Lab 12/04/16 1628  TROPONINI 0.07*   ------------------------------------------------------------------------------------------------------------------  RADIOLOGY:  Ct Head Wo Contrast  Result Date: 12/04/2016 CLINICAL DATA:  Head injury after fall. EXAM: CT HEAD WITHOUT CONTRAST CT CERVICAL SPINE WITHOUT CONTRAST TECHNIQUE: Multidetector CT imaging of the head and cervical spine was performed following the standard protocol without intravenous contrast. Multiplanar CT image reconstructions of the cervical spine were also generated. COMPARISON:  CT scan of September 23, 2016. FINDINGS: CT HEAD FINDINGS Brain: Mild diffuse cortical atrophy is noted. Mild chronic ischemic white matter disease is noted. Old left cerebellar infarction is noted. No mass effect or midline shift is noted. Ventricular size is within normal limits. There is no evidence of mass lesion, hemorrhage or acute infarction. Vascular: No hyperdense vessel or unexpected calcification. Skull: Normal. Negative for fracture or focal lesion. Sinuses/Orbits: Bilateral maxillary sinusitis. Other: None. CT CERVICAL SPINE FINDINGS Alignment: Normal. Skull base and vertebrae: No acute fracture. No primary bone lesion or focal pathologic process. Soft tissues and spinal canal: No prevertebral fluid or swelling. No visible canal hematoma. Disc levels: Severe degenerative disc disease is noted at C5-6 and C6-7 with anterior osteophyte formation. Upper chest: Negative. Other: None IMPRESSION: Mild diffuse cortical atrophy. Mild chronic ischemic white matter disease.  No acute  intracranial abnormality seen. Multilevel degenerative disc disease. No acute abnormality seen in the cervical spine. Electronically Signed   By: Lupita RaiderJames  Green Jr, M.D.   On: 12/04/2016 16:36   Ct Cervical Spine Wo Contrast  Result Date: 12/04/2016 CLINICAL DATA:  Head injury after fall. EXAM: CT HEAD WITHOUT CONTRAST CT CERVICAL SPINE WITHOUT CONTRAST TECHNIQUE: Multidetector CT imaging of the head and cervical spine was performed following the standard protocol without intravenous contrast. Multiplanar CT image reconstructions of the cervical spine were also generated. COMPARISON:  CT scan of September 23, 2016. FINDINGS: CT HEAD FINDINGS Brain: Mild diffuse cortical atrophy is noted. Mild chronic ischemic white matter disease is noted. Old left cerebellar infarction is noted. No mass effect or midline shift is noted. Ventricular size is within normal limits. There is no evidence of mass lesion, hemorrhage or acute infarction. Vascular: No hyperdense vessel or unexpected calcification. Skull: Normal. Negative for fracture or focal lesion. Sinuses/Orbits: Bilateral maxillary sinusitis. Other: None. CT CERVICAL SPINE FINDINGS Alignment: Normal. Skull base and vertebrae: No acute fracture. No primary bone lesion or focal pathologic process. Soft tissues and spinal canal: No prevertebral fluid or swelling. No visible canal hematoma. Disc levels: Severe degenerative disc disease is noted at C5-6 and C6-7 with anterior osteophyte formation. Upper chest: Negative. Other: None IMPRESSION: Mild diffuse cortical atrophy. Mild chronic ischemic white matter disease. No acute intracranial abnormality seen. Multilevel degenerative disc disease. No acute abnormality seen in the cervical spine. Electronically Signed   By: Lupita RaiderJames  Green Jr, M.D.   On: 12/04/2016 16:36   Dg Chest Port 1 View  Result Date: 12/04/2016 CLINICAL DATA:  Fall history of hypertension EXAM: PORTABLE CHEST 1 VIEW COMPARISON:  09/21/2016 FINDINGS:  Hyperinflation. No acute pulmonary infiltrate, pleural effusion, or pneumothorax. The cardiomediastinal silhouette is within normal limits. Aortic atherosclerosis. IMPRESSION: No active disease. Electronically Signed   By: Jasmine PangKim  Fujinaga M.D.   On: 12/04/2016 16:32     ASSESSMENT AND PLAN:   83 year old male with past medical history of dementia, hypertension, history of previous CVA, osteoarthritis of present the hospital after a fall and also noted to have altered mental status.  1. Altered mental status-metabolic encephalopathy secondary to UTI combined with underlying dementia. -Continue IV ceftriaxone for the UTI. Mental status improved since yesterday.   -CT scan of the head and cervical spine negative for acute pathology.  2. Urinary tract infection-on IV ceftriaxone and Urine cultures + for E. Coli which is pansensitive.   - will change to oral ceftin upon discharge.   3.history of previous CVA-continue Plavix.  4. Dementia-continue Aricept. - cont. Remeron.   5. Essential hypertension-continue Norvasc, as needed IV hydralazine.  6. BPH-continue Flomax.  Possible d/c later today or tomorrow depending on PO intake and mental status improvement.   All the records are reviewed and case discussed with Care Management/Social Worker. Management plans discussed with the patient, family and they are in agreement.  CODE STATUS: DNR  DVT Prophylaxis: Lovenox  TOTAL TIME TAKING CARE OF THIS PATIENT: 25 minutes.   POSSIBLE D/C IN 1-2 DAYS, DEPENDING ON CLINICAL CONDITION.   Houston SirenSAINANI,VIVEK J M.D on 12/06/2016 at 12:14 PM  Between 7am to 6pm - Pager - [phone]  After 6pm go to [email or web address] - Social research officer, governmentpassword EPAS [facility]  [facility] [place] [facility]  Office  [phone]  CC: Primary care physician; Maple HudsonGilbert, Richard L Jr., MD

## 2016-12-06 NOTE — Plan of Care (Signed)
Problem: Education: Goal: Knowledge of [place] [facility] Education information/materials will improve VSS, free of falls during shift.  No s/s pain.  Asleep majority of shift.  Foley CDI.  Bed in low position, call bell within reach.  WCTM.

## 2016-12-06 NOTE — Progress Notes (Signed)
Initial Nutrition Assessment  DOCUMENTATION CODES:   Severe malnutrition in context of chronic illness, Underweight  INTERVENTION:  Provide chopped meats with extra gravy. Provide whole milk on each tray.  Provide Ensure Enlive po BID, each supplement provides 350 kcal and 20 grams of protein. Provide chilled or over ice.  Provide daily multivitamin with minerals.  Patient would benefit from feeding assistance at each meal and with oral nutrition supplements between meals.  NUTRITION DIAGNOSIS:   Malnutrition (Severe) related to chronic illness (dementia, advanced age) as evidenced by severe depletion of body fat, severe depletion of muscle mass, 15.8 percent weight loss over 4 months.  GOAL:   Patient will meet greater than or equal to 90% of their needs  MONITOR:   PO intake, Supplement acceptance, Labs, Weight trends, I & O's  REASON FOR ASSESSMENT:   Malnutrition Screening Tool    ASSESSMENT:   83 year old male with PMHx of dementia, HTN, hx CVA, arthritis who presented from [place] after a fall and with confusion, found to have UTI.   Met with patient at bedside. Not a great historian in setting of dementia, but able to answer some questions appropriately. No family at bedside. He reports he does not have a good appetite and has not wanted to eat the past few days. Reports his intake varies and he may eat 2-4 meals per day depending on how he feels. Patient with upper and lower dentures. Reports he can chew well with them in. Reports he occasionally drinks Ensure when he feels like it. He enjoys whole milk.  Patient unsure of UBW. Per chart patient was 128.8 lbs on 08/01/2016. He has lost 20.4 lbs (15.8% body weight) over 4 months, which is significant for time frame.  At end of assessment, patient's dinner tray was brought in. Hostess cup up his meat for him and opened his milk. He immediately began eating. By the time RD assessment was finished patient had eaten 50% of  his roll, a few bites of chicken with mashed potatoes and gravy, some carrots, and had several large sips of milk. He was continuing to eat with NT when RD left.  Meal Completion: 0-10%  Medications reviewed and include: Remeron 30 mg QHS, NS @ 50 ml/hr, ceftriaxone.  Labs reviewed: Bun 22.  Nutrition-Focused physical exam completed. Findings are severe fat depletion (orbital region, upper arm region, thoracic/lumbar region), severe muscle depletion (temple region, clavicle bone region, clavicle/acromion bone region, scapular bone region, dorsal hand, patellar region, anterior thigh region, posterior calf region), and no edema. Per RN edema assessment patient has scrotal edema.   Discussed with RN. Son has been in. He reports patient does not eat well when he feels poorly. Patient has not had much of anything to eat today, including Ensure and Magic Cup offered.  Diet Order:  Diet regular Room service appropriate? Yes; Fluid consistency: Thin  Skin:  Reviewed, no issues  Last BM:  12/06/2016 - small type 7  Height:   Ht Readings from Last 1 Encounters:  12/05/16 _0  (1.702 m)    Weight:   Wt Readings from Last 1 Encounters:  12/05/16 108 lb 6.4 oz (49.2 kg)    Ideal Body Weight:  67.3 kg  BMI:  Body mass index is 16.98 kg/m.  Estimated Nutritional Needs:   Kcal:  1475-1720 (30-35 kcal/kg)  Protein:  75-90 grams (1.5-1.8 grams/kg)  Fluid:  1.2 L/day (25 ml/kg)  EDUCATION NEEDS:   Education needs no appropriate at this  time  Willey Blade, MS, RD, LDN Pager: [phone] After Hours Pager: [phone]

## 2016-12-07 LAB — URINE CULTURE: Culture: >=100000 — AB

## 2016-12-07 MED ORDER — CIPROFLOXACIN HCL 250 MG PO TABS: 250.0000 mg | ORAL_TABLET | Freq: Two times a day (BID) | ORAL | 0 refills | Status: AC

## 2016-12-07 NOTE — Discharge Summary (Signed)
[employer] - [place] at [facility]   PATIENT NAME: Larry Davidson    MR#:  [id number]  DATE OF BIRTH:  June 05, 1923  DATE OF ADMISSION:  12/04/2016 ADMITTING PHYSICIAN: Adrian Saran, MD  DATE OF DISCHARGE: 12/07/2016   PRIMARY CARE PHYSICIAN: Maple Hudson., MD    ADMISSION DIAGNOSIS:  Fall, initial encounter [phone].[id number]] Sepsis, due to unspecified organism (HCC) [A41.9] Urinary tract infection without hematuria, site unspecified [N39.0]  DISCHARGE DIAGNOSIS:  Active Problems:   Sepsis (HCC)   SECONDARY DIAGNOSIS:   Past Medical History:  Diagnosis Date  . Arthritis   . Dementia   . Hypertension   . Stroke [facility])     HOSPITAL COURSE:   83 year old male with past medical history of dementia, hypertension, history of previous CVA, osteoarthritis of present the hospital after a fall and also noted to have altered mental status.  1. Altered mental status-metabolic encephalopathy secondary to UTI combined with underlying dementia. -patient was treated with IV ceftriaxone while in the hospital for the UTI and his mental status is improved and is back to baseline now. -CT scan of the head and cervical spine were negative for any acute pathology.  2. Urinary tract infection-patient was treated with IV ceftriaxone while in the hospital. Urine cultures are positive for Escherichia coli and Pseudomonas. Patient presently is being discharged on oral ciprofloxacin for additional 5 days.  3.history of previous CVA- pt. Will continue Plavix.  4. Dementia- pt. Will continue Aricept, Remeron.   DISCHARGE CONDITIONS:   Stable.   CONSULTS OBTAINED:    DRUG ALLERGIES:  No Known Allergies  DISCHARGE MEDICATIONS:   Allergies as of 12/07/2016   No Known Allergies     Medication List    STOP taking these medications   amLODipine 5 MG tablet Commonly known as:  NORVASC   cephALEXin 500 MG capsule Commonly known as:  KEFLEX   clopidogrel 75 MG  tablet Commonly known as:  PLAVIX   donepezil 10 MG tablet Commonly known as:  ARICEPT   finasteride 5 MG tablet Commonly known as:  PROSCAR   mirtazapine 30 MG tablet Commonly known as:  REMERON   tamsulosin 0.4 MG Caps capsule Commonly known as:  FLOMAX     TAKE these medications   acetaminophen 325 MG tablet Commonly known as:  TYLENOL Take 650 mg by mouth every 4 (four) hours as needed.   ciprofloxacin 250 MG tablet Commonly known as:  CIPRO Take 1 tablet (250 mg total) by mouth 2 (two) times daily.   ENDIT EX Apply 1 application topically 3 (three) times daily as needed.   haloperidol 0.5 MG tablet Commonly known as:  HALDOL Take 0.5 mg by mouth every 6 (six) hours as needed for agitation.   VITAMIN D-1000 MAX ST 1000 units tablet Generic drug:  Cholecalciferol Take by mouth.   zinc oxide 20 % ointment Apply 1 application topically as needed for irritation.            Discharge Care Instructions        Start     Ordered   12/07/16 0000  ciprofloxacin (CIPRO) 250 MG tablet  2 times daily     12/07/16 1307   12/07/16 0000  Activity as tolerated - No restrictions     12/07/16 1307   12/07/16 0000  Diet general     12/07/16 1307        DISCHARGE INSTRUCTIONS:   DIET:  Regular diet  DISCHARGE CONDITION:  Stable  ACTIVITY:  Activity as tolerated  OXYGEN:  Home Oxygen: No.   Oxygen Delivery: room air  DISCHARGE LOCATION:  Assisted Living.    If you experience worsening of your admission symptoms, develop shortness of breath, life threatening emergency, suicidal or homicidal thoughts you must seek medical attention immediately by calling 911 or calling your MD immediately  if symptoms less severe.  You Must read complete instructions/literature along with all the possible adverse reactions/side effects for all the Medicines you take and that have been prescribed to you. Take any new Medicines after you have completely understood and accpet  all the possible adverse reactions/side effects.   Please note  You were cared for by a hospitalist during your hospital stay. If you have any questions about your discharge medications or the care you received while you were in the hospital after you are discharged, you can call the unit and asked to speak with the hospitalist on call if the hospitalist that took care of you is not available. Once you are discharged, your primary care physician will handle any further medical issues. Please note that NO REFILLS for any discharge medications will be authorized once you are discharged, as it is imperative that you return to your primary care physician (or establish a relationship with a primary care physician if you do not have one) for your aftercare needs so that they can reassess your need for medications and monitor your lab values.     Today   No acute events overnight. No complaints. Mental status improved since admission.   VITAL SIGNS:  Blood pressure (!) 160/72, pulse 76, temperature 98 F (36.7 C), resp. rate 20, height  (1.702 m), weight 49.2 kg (108 lb 6.4 oz), SpO2 99 %.  I/O:   Intake/Output Summary (Last 24 hours) at 12/07/16 1308 Last data filed at 12/07/16 0615  Gross per 24 hour  Intake              240 ml  Output             1300 ml  Net            -1060 ml    PHYSICAL EXAMINATION:   GENERAL:  83 y.o.-year-old patient lying in bed sleepy but awakens to verbal commands.  EYES: Pupils equal, round, reactive to light. No scleral icterus. Extraocular muscles intact.  HEENT: Head atraumatic, normocephalic. Oropharynx and nasopharynx clear.  NECK:  Supple, no jugular venous distention. No thyroid enlargement, no tenderness.  LUNGS: Normal breath sounds bilaterally, no wheezing, rales, rhonchi. No use of accessory muscles of respiration.  CARDIOVASCULAR: S1, S2 normal. No murmurs, rubs, or gallops.  ABDOMEN: Soft, nontender, nondistended. Bowel sounds present. No  organomegaly or mass.  EXTREMITIES: No cyanosis, clubbing or edema b/l.    NEUROLOGIC: Cranial nerves II through XII are intact. No focal Motor or sensory deficits b/l. Globally weak  PSYCHIATRIC: The patient is alert and oriented x 1.  SKIN: No obvious rash, lesion, or ulcer.   Chronic Indwelling Foley in place with Yellow urine draining.   DATA REVIEW:   CBC  Recent Labs Lab 12/06/16 0509  WBC 12.3*  HGB 11.6*  HCT 34.6*  PLT 316    Chemistries   Recent Labs Lab 12/04/16 1628 12/05/16 0358  NA 137 139  K 4.4 3.7  CL 99* 107  CO2 30 27  GLUCOSE 125* 100*  BUN 28* 22*  CREATININE 1.24 1.08  CALCIUM 10.0 9.2  AST 23  --  ALT 14*  --   ALKPHOS 71  --   BILITOT 0.5  --     Cardiac Enzymes  Recent Labs Lab 12/04/16 1628  TROPONINI 0.07*    Microbiology Results  Results for orders placed or performed during the hospital encounter of 12/04/16  Urine culture     Status: Abnormal   Collection Time: 12/04/16  3:56 PM  Result Value Ref Range Status   Specimen Description URINE, RANDOM  Final   Special Requests NONE  Final   Culture (A)  Final    >=100,000 COLONIES/mL ESCHERICHIA COLI 50,000 COLONIES/mL PSEUDOMONAS AERUGINOSA    Report Status 12/07/2016 FINAL  Final   Organism ID, Bacteria ESCHERICHIA COLI (A)  Final   Organism ID, Bacteria PSEUDOMONAS AERUGINOSA (A)  Final      Susceptibility   Escherichia coli - MIC*    AMPICILLIN <=2 SENSITIVE Sensitive     CEFAZOLIN <=4 SENSITIVE Sensitive     CEFTRIAXONE <=1 SENSITIVE Sensitive     CIPROFLOXACIN <=0.25 SENSITIVE Sensitive     GENTAMICIN <=1 SENSITIVE Sensitive     IMIPENEM <=0.25 SENSITIVE Sensitive     NITROFURANTOIN <=16 SENSITIVE Sensitive     TRIMETH/SULFA <=20 SENSITIVE Sensitive     AMPICILLIN/SULBACTAM <=2 SENSITIVE Sensitive     PIP/TAZO <=4 SENSITIVE Sensitive     Extended ESBL NEGATIVE Sensitive     * >=100,000 COLONIES/mL ESCHERICHIA COLI   Pseudomonas aeruginosa - MIC*     CEFTAZIDIME 4 SENSITIVE Sensitive     CIPROFLOXACIN <=0.25 SENSITIVE Sensitive     GENTAMICIN <=1 SENSITIVE Sensitive     IMIPENEM 1 SENSITIVE Sensitive     PIP/TAZO 8 SENSITIVE Sensitive     CEFEPIME 4 SENSITIVE Sensitive     * 50,000 COLONIES/mL PSEUDOMONAS AERUGINOSA  Blood Culture (routine x 2)     Status: None (Preliminary result)   Collection Time: 12/04/16  4:29 PM  Result Value Ref Range Status   Specimen Description BLOOD ARM  Final   Special Requests   Final    BOTTLES DRAWN AEROBIC AND ANAEROBIC Blood Culture adequate volume   Culture NO GROWTH 3 DAYS  Final   Report Status PENDING  Incomplete  Blood Culture (routine x 2)     Status: None (Preliminary result)   Collection Time: 12/04/16  7:18 PM  Result Value Ref Range Status   Specimen Description BLOOD LEFT FOREARM  Final   Special Requests   Final    BOTTLES DRAWN AEROBIC AND ANAEROBIC Blood Culture adequate volume   Culture NO GROWTH 3 DAYS  Final   Report Status PENDING  Incomplete  MRSA PCR Screening     Status: None   Collection Time: 12/04/16  8:32 PM  Result Value Ref Range Status   MRSA by PCR NEGATIVE NEGATIVE Final    Comment:        The GeneXpert MRSA Assay (FDA approved for NASAL specimens only), is one component of a comprehensive MRSA colonization surveillance program. It is not intended to diagnose MRSA infection nor to guide or monitor treatment for MRSA infections.     RADIOLOGY:  No results found.    Management plans discussed with the patient, family and they are in agreement.  CODE STATUS:     Code Status Orders        Start     Ordered   12/04/16 1951  Do not attempt resuscitation (DNR)  Continuous    Question Answer Comment  In the event of cardiac or respiratory  ARREST Do not call a "code blue"   In the event of cardiac or respiratory ARREST Do not perform Intubation, CPR, defibrillation or ACLS   In the event of cardiac or respiratory ARREST Use medication by any route,  position, wound care, and other measures to relive pain and suffering. May use oxygen, suction and manual treatment of airway obstruction as needed for comfort.      12/04/16 1950    TOTAL TIME TAKING CARE OF THIS PATIENT: 40 minutes.    Houston SirenSAINANI,Solei Wubben J M.D on 12/07/2016 at 1:08 PM  Between 7am to 6pm - Pager - [phone]  After 6pm go to [email or web address] - Social research officer, governmentpassword EPAS [facility]  [facility] [place] [facility]  Office  [phone]  CC: Primary care physician; Maple HudsonGilbert, Richard L Jr., MD

## 2016-12-07 NOTE — Progress Notes (Signed)
Visit made. Patient is currently followed by [facility] [name] [facility] with a hospice diagnosis of Cerebrovascular disease. He is a DNR code with out of facility DNR in place. [facility] after a fall with altered mental status. He was found to meet sepsis criteria and admitted for treatment of a UTI. Hospice was not notified prior to transport. Patient seen lying in bed, eyes closed, did respond to questions, denied pain. Breakfast tray at bedside, he did not eat this morning. Staff Aide aware and reports that per son's request tray will be offered again at lunch time. Foley remains in place, draining clear yellow urine. Per chart note review he has been taking his oral medications. He continues on IV antibiotics, no PRN medications given for pain or anxiety. Hospital care team made aware of Hospice involvement. No family at bedside, son Lorin PicketScott expected back later. Will continue to follow and update hospice team. Thank you. Dayna BarkerKaren Granillo RN, BSN, [facility] [name], [facility] Liaison [phone]

## 2016-12-07 NOTE — Care Management (Signed)
Clydie BraunKaren with Hospice notified of discharge.

## 2016-12-07 NOTE — Care Management (Signed)
[facility]  Noted that patient WC bound at baseline.  Patient is followed by [facility]  [place].  Clydie BraunKaren with Hospice is aware of admission.

## 2016-12-07 NOTE — Progress Notes (Signed)
Pt is being discharged to [facility]. Report given to Victoria,LPN.  A copy of AVS placed in discharge packet. Awaiting [facility] transportation.

## 2016-12-07 NOTE — Clinical Social Work Note (Addendum)
CSW spoke to patient's son Lorin PicketScott, and he would like [facility] to pick up patient once she is ready for discharge.  CSW contacted [facility] and they can accept patient back today, once discharge summary has been completed, and FL2 has been sent.  [facility] said they can pick patient up from hospital.  Patient to be d/c'ed today to [facility].  Patient and family agreeable to plans will transport via facility transportation RN to call report.  Ervin KnackEric R. Swetha Rayle, MSW, Theresia MajorsLCSWA [phone]  12/07/2016 11:48 AM

## 2016-12-08 ENCOUNTER — Emergency Department
Admission: EM | Admit: 2016-12-08 | Discharge: 2016-12-08 | Disposition: A | Discharge status: Skilled Nursing Facility | Attending: Emergency Medicine | Admitting: Emergency Medicine

## 2016-12-08 ENCOUNTER — Telehealth: Payer: Self-pay | Admitting: Family Medicine

## 2016-12-08 ENCOUNTER — Encounter: Payer: Self-pay | Admitting: Emergency Medicine

## 2016-12-08 ENCOUNTER — Emergency Department

## 2016-12-08 DIAGNOSIS — Y92129 Unspecified place in nursing home as the place of occurrence of the external cause: Secondary | ICD-10-CM | POA: Insufficient documentation

## 2016-12-08 DIAGNOSIS — N3000 Acute cystitis without hematuria: Secondary | ICD-10-CM | POA: Insufficient documentation

## 2016-12-08 DIAGNOSIS — S0990XA Unspecified injury of head, initial encounter: Secondary | ICD-10-CM | POA: Insufficient documentation

## 2016-12-08 DIAGNOSIS — Z8673 Personal history of transient ischemic attack (TIA), and cerebral infarction without residual deficits: Secondary | ICD-10-CM | POA: Insufficient documentation

## 2016-12-08 DIAGNOSIS — I10 Essential (primary) hypertension: Secondary | ICD-10-CM | POA: Insufficient documentation

## 2016-12-08 DIAGNOSIS — Z87891 Personal history of nicotine dependence: Secondary | ICD-10-CM | POA: Insufficient documentation

## 2016-12-08 DIAGNOSIS — W19XXXA Unspecified fall, initial encounter: Secondary | ICD-10-CM | POA: Insufficient documentation

## 2016-12-08 DIAGNOSIS — Y939 Activity, unspecified: Secondary | ICD-10-CM | POA: Diagnosis not present

## 2016-12-08 DIAGNOSIS — S199XXA Unspecified injury of neck, initial encounter: Secondary | ICD-10-CM | POA: Diagnosis not present

## 2016-12-08 DIAGNOSIS — Y998 Other external cause status: Secondary | ICD-10-CM | POA: Diagnosis not present

## 2016-12-08 LAB — URINALYSIS, COMPLETE (UACMP) WITH MICROSCOPIC
Bacteria, UA: NONE SEEN
Bilirubin Urine: NEGATIVE
Glucose, UA: NEGATIVE mg/dL
Ketones, ur: 20 mg/dL — AB
Nitrite: NEGATIVE
PH: 5 (ref 5.0–8.0)
Protein, ur: 30 mg/dL — AB
SPECIFIC GRAVITY, URINE: 1.014 (ref 1.005–1.030)
SQUAMOUS EPITHELIAL / LPF: NONE SEEN

## 2016-12-08 LAB — CBC WITH DIFFERENTIAL/PLATELET
Basophils Absolute: 0.1 10*3/uL (ref 0–0.1)
Basophils Relative: 1 %
EOS PCT: 0 %
Eosinophils Absolute: 0 10*3/uL (ref 0–0.7)
HEMATOCRIT: 41.8 % (ref 40.0–52.0)
Hemoglobin: 13.7 g/dL (ref 13.0–18.0)
LYMPHS ABS: 1.5 10*3/uL (ref 1.0–3.6)
LYMPHS PCT: 10 %
MCH: 30.2 pg (ref 26.0–34.0)
MCHC: 32.8 g/dL (ref 32.0–36.0)
MCV: 92.1 fL (ref 80.0–100.0)
MONO ABS: 0.7 10*3/uL (ref 0.2–1.0)
Monocytes Relative: 5 %
NEUTROS ABS: 12.4 10*3/uL — AB (ref 1.4–6.5)
Neutrophils Relative %: 84 %
PLATELETS: 435 10*3/uL (ref 150–440)
RBC: 4.53 MIL/uL (ref 4.40–5.90)
RDW: 15.1 % — AB (ref 11.5–14.5)
WBC: 14.9 10*3/uL — ABNORMAL HIGH (ref 3.8–10.6)

## 2016-12-08 LAB — TROPONIN I

## 2016-12-08 LAB — COMPREHENSIVE METABOLIC PANEL
ALT: 11 U/L — AB (ref 17–63)
AST: 17 U/L (ref 15–41)
Albumin: 2.7 g/dL — ABNORMAL LOW (ref 3.5–5.0)
Alkaline Phosphatase: 59 U/L (ref 38–126)
Anion gap: 11 (ref 5–15)
BILIRUBIN TOTAL: 0.8 mg/dL (ref 0.3–1.2)
BUN: 22 mg/dL — AB (ref 6–20)
CHLORIDE: 108 mmol/L (ref 101–111)
CO2: 26 mmol/L (ref 22–32)
CREATININE: 1.16 mg/dL (ref 0.61–1.24)
Calcium: 10.4 mg/dL — ABNORMAL HIGH (ref 8.9–10.3)
GFR calc Af Amer: >60 mL/min (ref >60)
GFR, EST NON AFRICAN AMERICAN: 54 mL/min — AB (ref >60)
Glucose, Bld: 96 mg/dL (ref 65–99)
Potassium: 4.3 mmol/L (ref 3.5–5.1)
Sodium: 145 mmol/L (ref 135–145)
TOTAL PROTEIN: 7.6 g/dL (ref 6.5–8.1)

## 2016-12-08 MED ORDER — DEXTROSE 5 % IV SOLN
1.0000 g | Freq: Once | INTRAVENOUS | Status: AC
  Administered 2016-12-08: 1 g via INTRAVENOUS
  Filled 2016-12-08: qty 10

## 2016-12-08 MED ORDER — CEPHALEXIN 250 MG PO CAPS: 250.0000 mg | ORAL_CAPSULE | Freq: Four times a day (QID) | ORAL | 0 refills | Status: AC

## 2016-12-08 NOTE — Progress Notes (Signed)
ED visit made. Patient is currently followed by [facility] [name] [facility] with a hospice diagnosis of Cerebrovascular disease. He is a DNR code with out of facility DNR in place. [facility] ED for evaluation after a fall. Patient seen lying on the ED stretcher, abrasion noted to right eye brow/upper cheek. Did open his eyes when spoken to and acknowledged Clinical research associatewriter. Chart notes reviewed, head and cervical CT negative for any acute fracture/or injury. WBC elevated at 14.9, he was discharged yesterday 9/10 following treatment for a UTI and fall on 9/8.  Patient's son Lorin PicketScott was in earlier and had to leave, no family currently at bedside. Hospice team updated. Hospital CSW updated.Tonette BihariJeneya made aware that patient is followed by hospice. Discharge vs admission unknown at this time. Will continue to follow and update hospice team. Thank you. Dayna BarkerKaren Desjardin RN, BSN, [facility] [name], hospital liaison [phone] c

## 2016-12-08 NOTE — Telephone Encounter (Signed)
FYI-Sheila Ocasio V Julya Alioto, RMA  

## 2016-12-08 NOTE — ED Notes (Signed)
Pt unable to sign for d/c instructions.  DNR and D/C instructions and rx given to transporter for [facility].  Pt d/c via wheelchair back to [facility].

## 2016-12-08 NOTE — Telephone Encounter (Signed)
Larry Davidson with Hospice called to advise [facility] and is [facility].  [phone]/WJCB#[phone]/MW

## 2016-12-08 NOTE — ED Provider Notes (Signed)
[facility] Emergency Department Provider Note       Time seen: ----------------------------------------- 7:47 AM on 12/08/2016 -----------------------------------------  Level V caveat: History/ROS limited by dementia   I have reviewed the triage vital signs and the nursing notes.   HISTORY   Chief Complaint No chief complaint on file.    HPI Larry Davidson is a 83 y.o. male who presents to the ED for Unwitnessed fall at the nursing home. Patient was found on the floor of his nursing home room. He was noted to have abrasions to the right side of his face as well as right elbow and both his knees. Patient was complaining of one point of neck pain and therefore he was placed in a cervical collar. He denies complaints on arrival.   Past Medical History:  Diagnosis Date  . Arthritis   . Dementia   . Hypertension   . Stroke [facility](HCC)     Patient Active Problem List   Diagnosis Date Noted  . Bladder outlet obstruction 09/25/2016  . Failure to thrive in adult 09/25/2016  . Lethargy   . Urinary tract infection associated with catheterization of urinary tract, initial encounter (HCC) 09/24/2016  . Sepsis due to Escherichia coli (E. coli) (HCC) 09/24/2016  . Acute renal insufficiency 09/24/2016  . Demand ischemia (HCC) 09/24/2016  . Lactic acidosis 09/24/2016  . Leukocytosis 09/24/2016  . Episode of unresponsiveness 09/24/2016  . Hypokalemia 09/24/2016  . Generalized weakness 09/24/2016  . Acute urinary retention 09/24/2016  . Goals of care, counseling/discussion   . Palliative care encounter   . Sepsis (HCC) 09/21/2016  . Hydrocele, right 09/01/2016  . Urinary retention 08/31/2016  . Hypertension 11/06/2015  . Dementia 11/06/2015  . Hyperlipidemia 11/06/2015    No past surgical history on file.  Allergies Patient has no known allergies.  Social History Social History  Substance Use Topics  . Smoking status: Former Smoker    Quit date:  03/30/1951  . Smokeless tobacco: Never Used  . Alcohol use No    Review of Systems  Musculoskeletal: positive for reported neck pain earlier Skin: positive for abrasions review of systems is otherwise unknown  All systems negative/normal/unremarkable except as stated in the HPI  ____________________________________________   PHYSICAL EXAM:  VITAL SIGNS: ED Triage Vitals  Enc Vitals Group     BP      Pulse      Resp      Temp      Temp src      SpO2      Weight      Height      Head Circumference      Peak Flow      Pain Score      Pain Loc      Pain Edu?      Excl. in GC?     Constitutional: Alert but disoriented, no distress is noted Eyes: Conjunctivae are normal. Normal extraocular movements. ENT   Head: Normocephalic, with right periorbital abrasions and right temporal abrasion   Nose: No congestion/rhinnorhea.   Mouth/Throat: Mucous membranes are moist.   Neck: No stridor. Cardiovascular: Normal rate, regular rhythm. No murmurs, rubs, or gallops. Respiratory: Normal respiratory effort without tachypnea nor retractions. Breath sounds are clear and equal bilaterally. No wheezes/rales/rhonchi. Gastrointestinal: Soft and nontender. Normal bowel sounds Musculoskeletal: Nontender with normal range of motion in extremities. No lower extremity tenderness nor edema. Neurologic:  Normal speech and language. No gross focal neurologic deficits are appreciated.  Skin:  abrasions are appreciated over the right temporal area, right elbow, both knees. ____________________________________________  EKG: Interpreted by me. normal sinus rhythm with a rate of 96 bpm, PVC, leftward axis, no evidence of hypertrophy or acute infarction  ____________________________________________  ED COURSE:  Pertinent labs & imaging results that were available during my care of the patient were reviewed by me and considered in my medical decision making (see chart for  details). Patient presents for an unwitnessed fall, we will assess with labs and imaging as indicated. Clinical Course as of Dec 08 1113  Tue Dec 08, 2016  0903 c-collar was removed  [JW]    Clinical Course User Index [JW] Emily Filbert, MD   Procedures ____________________________________________   LABS (pertinent positives/negatives)  Labs Reviewed  CBC WITH DIFFERENTIAL/PLATELET - Abnormal; Notable for the following:       Result Value   WBC 14.9 (*)    RDW 15.1 (*)    Neutro Abs 12.4 (*)    All other components within normal limits  COMPREHENSIVE METABOLIC PANEL - Abnormal; Notable for the following:    BUN 22 (*)    Calcium 10.4 (*)    Albumin 2.7 (*)    ALT 11 (*)    GFR calc non Af Amer 54 (*)    All other components within normal limits  URINALYSIS, COMPLETE (UACMP) WITH MICROSCOPIC - Abnormal; Notable for the following:    Color, Urine YELLOW (*)    APPearance CLOUDY (*)    Hgb urine dipstick LARGE (*)    Ketones, ur 20 (*)    Protein, ur 30 (*)    Leukocytes, UA LARGE (*)    All other components within normal limits  TROPONIN I    RADIOLOGY  CT head, C-spine IMPRESSION: Old bilateral cerebellar infarcts.  No acute intracranial abnormality.Atrophy, chronic microvascular disease.  Degenerative changes in the cervical spine. No acute findings.  IMPRESSION: Old bilateral cerebellar infarcts.  No acute intracranial abnormality.Atrophy, chronic microvascular disease.  Degenerative changes in the cervical spine. No acute findings. ____________________________________________  FINAL ASSESSMENT AND PLAN  fall, minor head injury, abrasion, UTI   Plan: Patient's labs and imaging were dictated above. Patient had presented for an unwitnessed fall the nursing home. He does have evidence of UTI and we changed out his Foley catheter as well as gave him IV antibiotics. He'll be discharged with a change in his antibiotic regimen. Family is agreeable  to plan, they understand that he is nearing end-of-life. He had previously been on hospice care.   Emily Filbert, MD   Note: This note was generated in part or whole with voice recognition software. Voice recognition is usually quite accurate but there are transcription errors that can and very often do occur. I apologize for any typographical errors that were not detected and corrected.     Emily Filbert, MD 12/08/16 1116

## 2016-12-08 NOTE — ED Triage Notes (Signed)
Pt to ed via ems from [facility] for [facility].  Per EMS pt was found on floor this am by day shift staff.  Unknown amount of time on floor.  Pt with abrasions to right elbow, bilat knees and right forehead.  No bleeding noted, small hematoma to right forehead.  Pt alert and answers questions.  Pt with urinary catheter in place, no urine noted in bag at this time.

## 2016-12-09 LAB — CULTURE, BLOOD (ROUTINE X 2)
CULTURE: NO GROWTH
Culture: NO GROWTH
SPECIAL REQUESTS: ADEQUATE
Special Requests: ADEQUATE

## 2016-12-10 ENCOUNTER — Other Ambulatory Visit: Payer: Self-pay

## 2016-12-10 NOTE — Patient Outreach (Signed)
[facility]) Care Management  12/10/2016  Winona LegatoHarold E Moch 1923-06-24 [id number]   Telephone Screen  Referral Date: 12/09/2016 Referral Source: HTA insurance  Referral Reason: Unknown Insurance: [employer]   Referral received and upon chart review noted that patient followed by [facility] [name] [facility] with a hospice diagnosis of Cerebrovascular disease. [facility] does not follow active patients with Hospice services.  Case is being closed at this time.   Plan:  RN CM will notify [facility] administrative assistant of case status.   Antionette Fairyoshanda Danisha Brassfield, RN,BSN,CCM [facility] Care Management Telephonic Care Management Coordinator Direct Phone: [phone] Toll Free: [phone] Fax: [phone]

## 2016-12-29 DIAGNOSIS — I679 Cerebrovascular disease, unspecified: Secondary | ICD-10-CM | POA: Diagnosis not present

## 2016-12-29 DIAGNOSIS — E43 Unspecified severe protein-calorie malnutrition: Secondary | ICD-10-CM | POA: Diagnosis not present

## 2016-12-29 DIAGNOSIS — F0390 Unspecified dementia without behavioral disturbance: Secondary | ICD-10-CM | POA: Diagnosis not present

## 2016-12-29 DIAGNOSIS — N179 Acute kidney failure, unspecified: Secondary | ICD-10-CM | POA: Diagnosis not present

## 2017-01-08 ENCOUNTER — Telehealth: Payer: Self-pay

## 2017-01-08 MED ORDER — CIPROFLOXACIN HCL 500 MG PO TABS: 500.0000 mg | ORAL_TABLET | Freq: Two times a day (BID) | ORAL | 0 refills | Status: DC

## 2017-01-08 NOTE — Telephone Encounter (Signed)
Received UA on patient, urine culture is pending still, from Hospice home. Per Dr Sherrie Mustache start Cipro 500 mg BID for 7 days. Carollee Herter advised at [facility]. Called in to [place] per Hospice -Consuella Lose, RMA

## 2017-01-15 ENCOUNTER — Ambulatory Visit: Payer: PPO | Admitting: Urology

## 2017-01-18 ENCOUNTER — Encounter: Payer: Self-pay | Admitting: Urology

## 2017-01-18 ENCOUNTER — Ambulatory Visit (INDEPENDENT_AMBULATORY_CARE_PROVIDER_SITE_OTHER): Payer: Medicare Other | Admitting: Urology

## 2017-01-18 VITALS — BP 173/108 | HR 169

## 2017-01-18 DIAGNOSIS — F0280 Dementia in other diseases classified elsewhere without behavioral disturbance: Secondary | ICD-10-CM | POA: Diagnosis not present

## 2017-01-18 DIAGNOSIS — R339 Retention of urine, unspecified: Secondary | ICD-10-CM | POA: Diagnosis not present

## 2017-01-18 DIAGNOSIS — G309 Alzheimer's disease, unspecified: Secondary | ICD-10-CM | POA: Diagnosis not present

## 2017-01-18 DIAGNOSIS — N433 Hydrocele, unspecified: Secondary | ICD-10-CM

## 2017-01-18 MED ORDER — LEVOFLOXACIN 500 MG PO TABS: 500.0000 mg | ORAL_TABLET | Freq: Every day | ORAL | 0 refills | Status: AC

## 2017-01-19 NOTE — Progress Notes (Signed)
01/18/2017 7:43 AM   Larry Davidson 1923/10/24 [id number]  Referring provider: Maple Hudson., MD [address] [place], [place] [address]  Chief Complaint  Patient presents with  . Testicle Pain    swelling    HPI: 83 yo male with Alzheimer's dementia and followed for chronic urinary retention with an indwelling Foley catheter and a right hydrocele.  He has had prior hydrocele aspiration.  He presents today with his son and wife.  They noticed increased right hemiscrotal swelling with complaints of increased pain on 10/19.  He has a history of recurrent urinary tract infections and today they have noticed worsening of his baseline mental status.  On the way over today he apparently pulled out his Foley catheter.  No fever or chills.  PMH: Past Medical History:  Diagnosis Date  . Arthritis   . Dementia   . Hypertension   . Stroke [facility])     Surgical History: History reviewed. No pertinent surgical history.  Home Medications:  Allergies as of 01/18/2017   No Known Allergies     Medication List       Accurate as of 01/18/17 11:59 PM. Always use your most recent med list.          ENDIT EX Apply 1 application topically 3 (three) times daily as needed.   levofloxacin 500 MG tablet Commonly known as:  LEVAQUIN Take 1 tablet (500 mg total) by mouth daily.   SENEXON-S 8.6-50 MG tablet Generic drug:  senna-docusate Take 1 tablet by mouth daily.   VITAMIN D-1000 MAX ST 1000 units tablet Generic drug:  Cholecalciferol Take by mouth.       Allergies: No Known Allergies  Family History: Family History  Problem Relation Age of Onset  . Prostate cancer Neg Hx   . Bladder Cancer Neg Hx   . Kidney cancer Neg Hx     Social History:  reports that he quit smoking about 65 years ago. He has never used smokeless tobacco. He reports that he does not drink alcohol or use drugs.  ROS: Not obtainable secondary to Alzheimer's  dementia  Physical Exam: BP (!) 173/108 (BP Location: Right Arm, Patient Position: Sitting, Cuff Size: Large)   Pulse (!) 169   Constitutional: Restless in wheelchair HEENT: [place] AT, moist mucus membranes.  Trachea midline, no masses. Cardiovascular: No clubbing, cyanosis, or edema. Respiratory: Normal respiratory effort, no increased work of breathing. GI: Abdomen is soft, nontender, nondistended, no abdominal masses GU: No CVA tenderness.  Foley catheter appears to be approximately halfway pulled out.  No penile bleeding noted.  Soft right hemiscrotal mass extending up into groin region.  No scrotal skin erythema or thickening.  No tenderness noted. Skin: No rashes, bruises or suspicious lesions. Lymph: No cervical or inguinal adenopathy. Neurologic: Grossly intact, no focal deficits, moving all 4 extremities. Psychiatric: Normal mood and affect.  Laboratory Data: Lab Results  Component Value Date   WBC 14.9 (H) 12/08/2016   HGB 13.7 12/08/2016   HCT 41.8 12/08/2016   MCV 92.1 12/08/2016   PLT 435 12/08/2016    Lab Results  Component Value Date   CREATININE 1.16 12/08/2016    Urinalysis Lab Results  Component Value Date   APPEARANCEUR CLOUDY (A) 12/08/2016   LEUKOCYTESUR LARGE (A) 12/08/2016   PROTEINUR 30 (A) 12/08/2016   GLUCOSEU NEGATIVE 12/08/2016   RBCU TOO NUMEROUS TO COUNT 12/08/2016   BILIRUBINUR NEGATIVE 12/08/2016   NITRITE NEGATIVE 12/08/2016    Lab  Results  Component Value Date   BACTERIA NONE SEEN 12/08/2016    Pertinent Imaging:  Results for orders placed during the hospital encounter of 09/21/16  US Renal   Narrative CLINICAL DATA:  83 year old male with acute renal insufficiency.  EXAM: RENAL / URINARY TRACT ULTRASOUND COMPLETE  COMPARISON:  None.  FINDINGS: Right Kidney:  Length: 9.9 cm. Cortical thinning and increased renal echogenicity noted. Multiple cysts are present. No hydronephrosis or definite solid mass.  Left  Kidney:  Length: 10.5 cm. Cortical thinning and increased renal echogenicity noted. Multiple cysts are present. No hydronephrosis or definite solid mass. A 1.3 cm nonobstructing calculus within the mid left kidney is noted.  Bladder:  A Foley catheter is present within the bladder.  IMPRESSION: Cortical thinning and increased renal echogenicity, compatible with medical renal disease.  No evidence of acute abnormality.  No evidence of hydronephrosis.  1.3 cm nonobstructing left renal calculus.  Multiple bilateral renal cysts.   Electronically Signed   By: Harmon PierJeffrey  Hu M.D.   On: 09/23/2016 18:20     Assessment & Plan:   1. Hydrocele, right No evidence of scrotal cellulitis, epididymitis or orchitis.  The hydrocele may be extending into the inguinal canal however cannot rule out the possibility of a hernia.  Scrotal ultrasound was ordered.  - US PELVIC DOPPLER (TORSION R/O OR MASS ARTERIAL FLOW); Future  2. Urinary retention His catheter was removed and replaced.  Urine was sent for culture.  Based on his mental status changes we will treat empirically with antibiotic therapy.  His most recent urine culture in September was positive for Pseudomonas and E. coli both sensitive to fluoroquinolone.  Rx Levaquin was sent to his pharmacy.  3. Alzheimer's dementia without behavioral disturbance, unspecified timing of dementia onset    Riki AltesScott C Rehmat Murtagh, MD  [facility] [address], [address] [place], [place] [address] [phone]

## 2017-01-22 ENCOUNTER — Encounter: Payer: Self-pay | Admitting: Family Medicine

## 2017-02-08 ENCOUNTER — Ambulatory Visit: Payer: PPO | Admitting: Family Medicine

## 2017-02-27 DEATH — deceased

## 2018-02-18 IMAGING — CT CT HEAD W/O CM
3 series · 15 of 47 positions shown, 18 images · non-contrast
Comparison: 01/09/2011 MR and CT.

CLINICAL DATA: [AGE] hypertensive male with fever and altered
mental status. Initial encounter.

EXAM:
CT HEAD WITHOUT CONTRAST
TECHNIQUE: Contiguous axial images were obtained from the base of the skull
through the vertex without intravenous contrast.

[Series 2: head wo · axial · 0.47mm/px · z∈[+203,+338]mm · 9 of 33 slices shown, 12 images]
[im 3/33  brain]
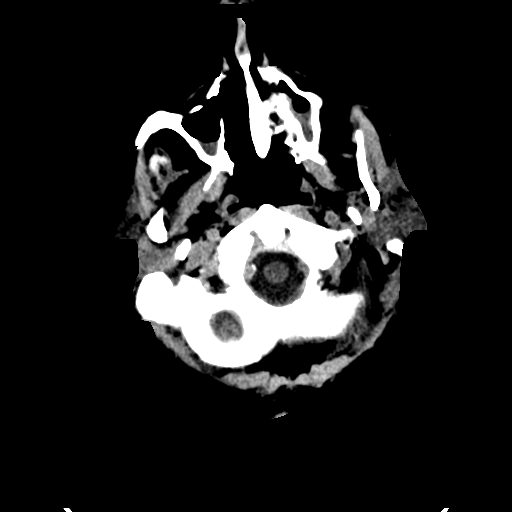
[im 3/33  bone]
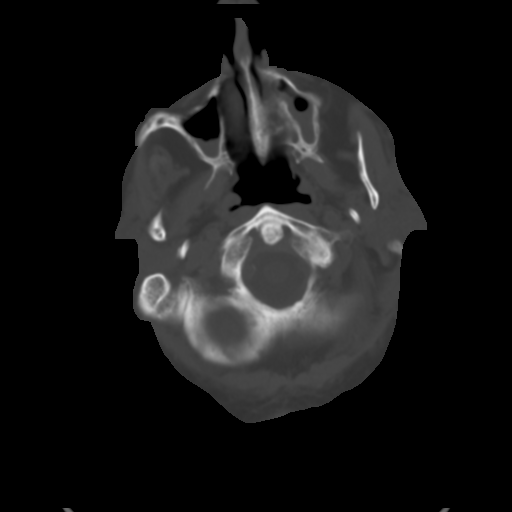
[im 6/33  brain]
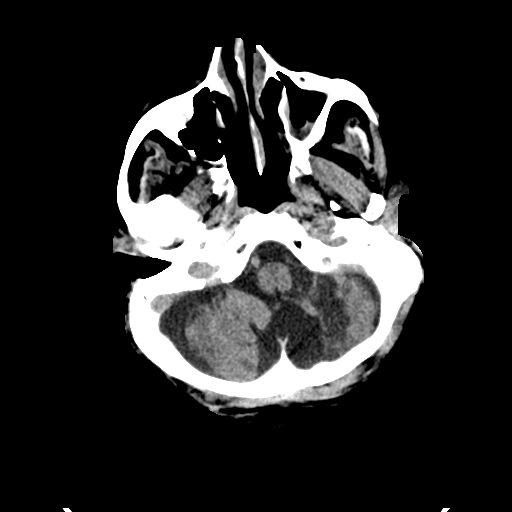
[im 9/33  brain]
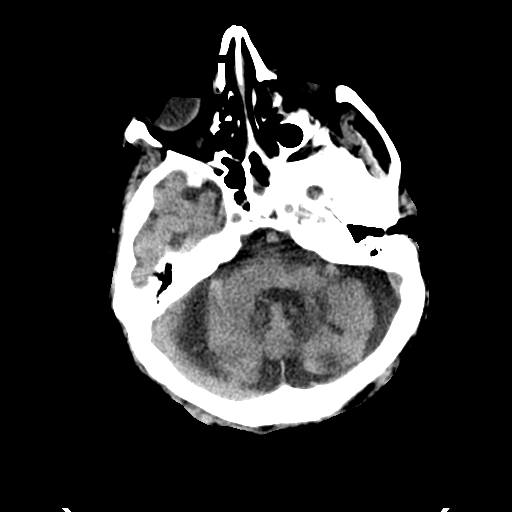
[im 13/33  brain]
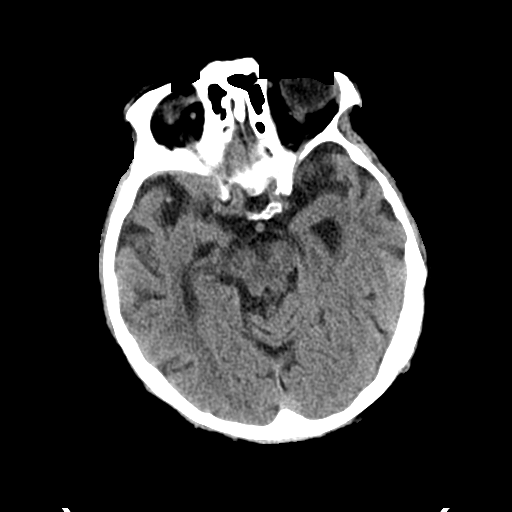
[im 17/33  brain]
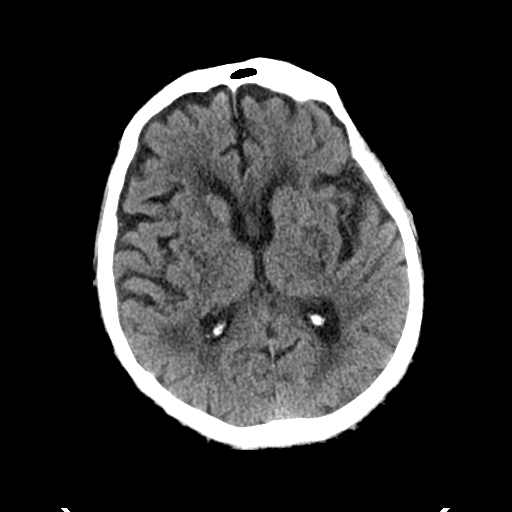
[im 17/33  bone]
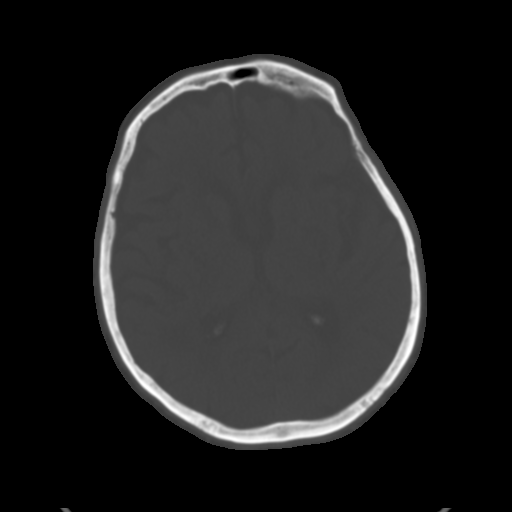
[im 20/33  brain]
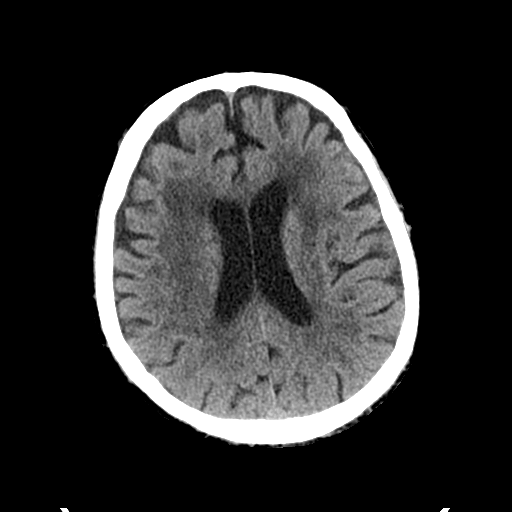
[im 24/33  brain]
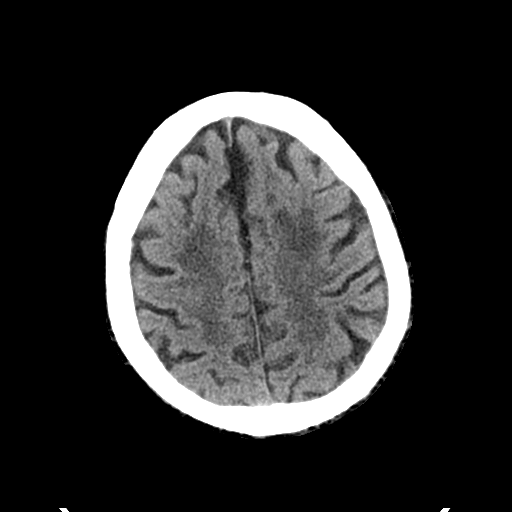
[im 27/33  brain]
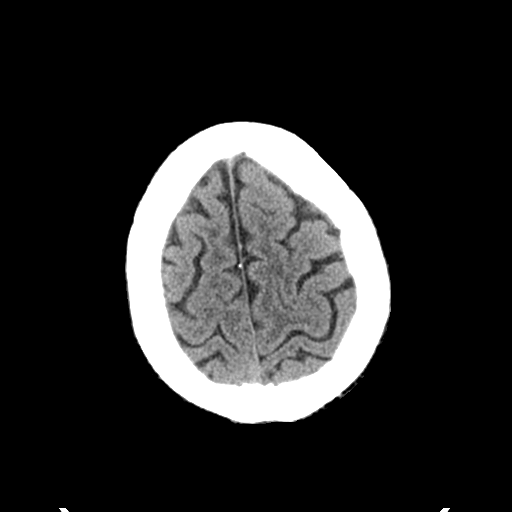
[im 30/33  brain]
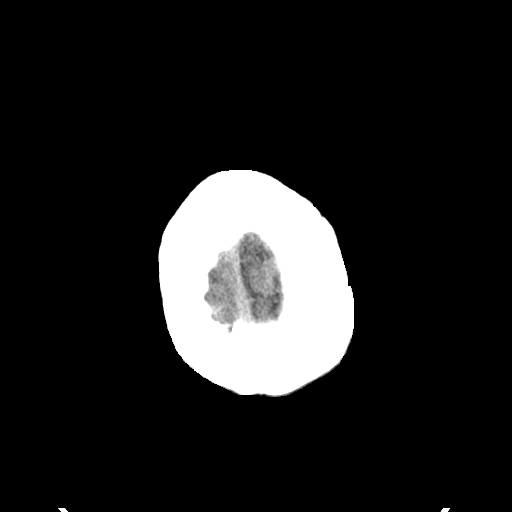
[im 30/33  bone]
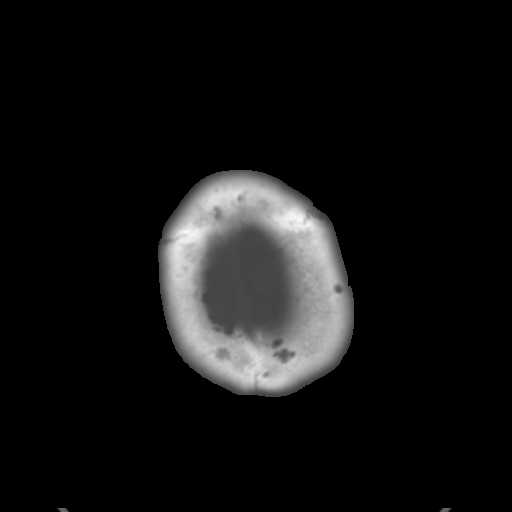

[Series 4: coronal soft tissue · coronal · 0.31mm/px · 3 of 61 slices shown]
[im 21/61  brain]
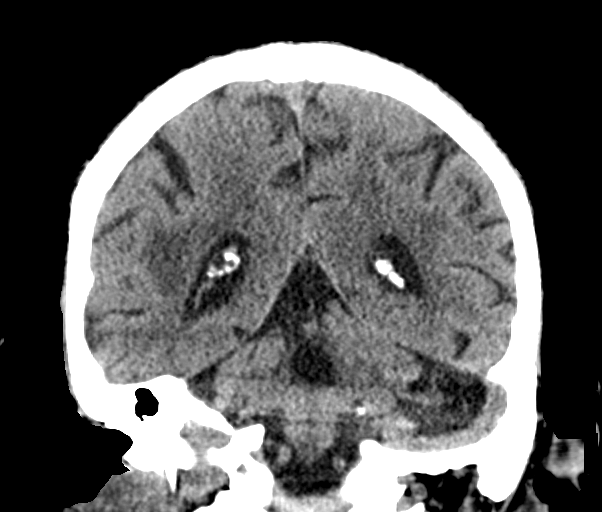
[im 27/61  brain]
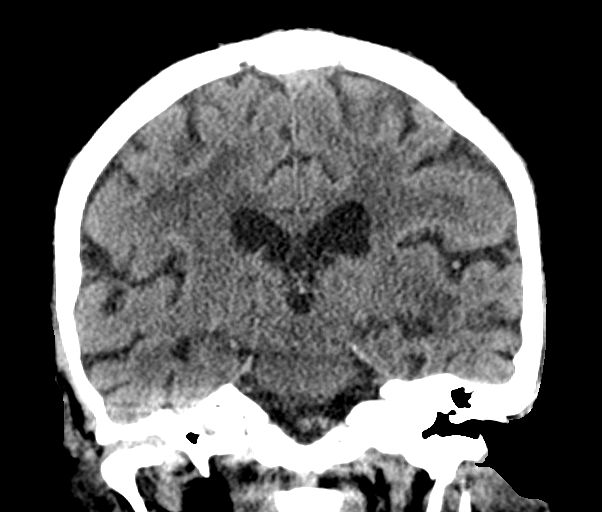
[im 34/61  brain]
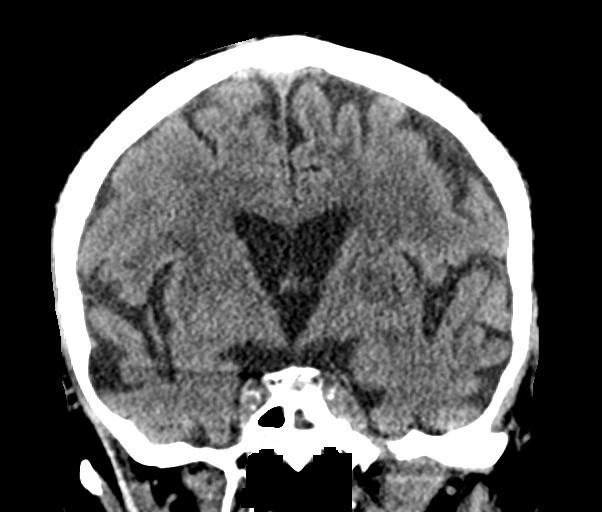

[Series 5: sagittal soft tissue · sagittal · 0.32mm/px · 3 of 56 slices shown]
[im 20/56  brain]
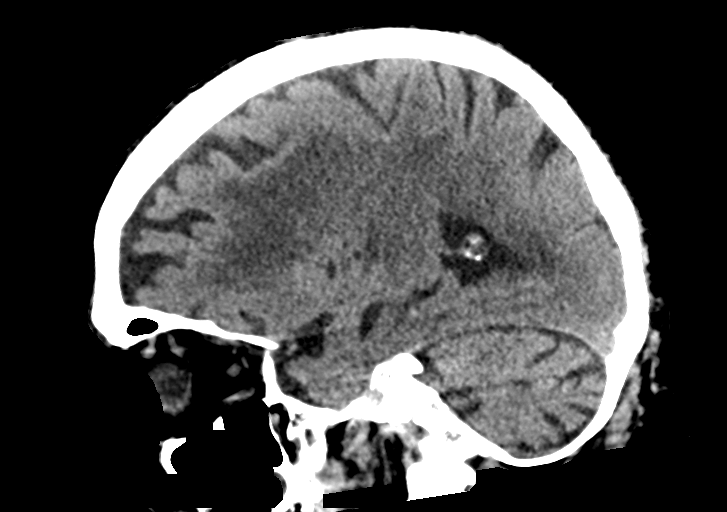
[im 28/56  brain]
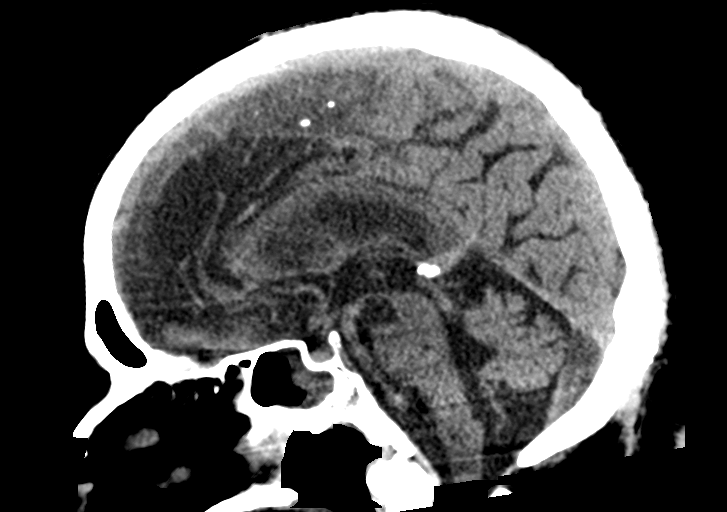
[im 36/56  brain]
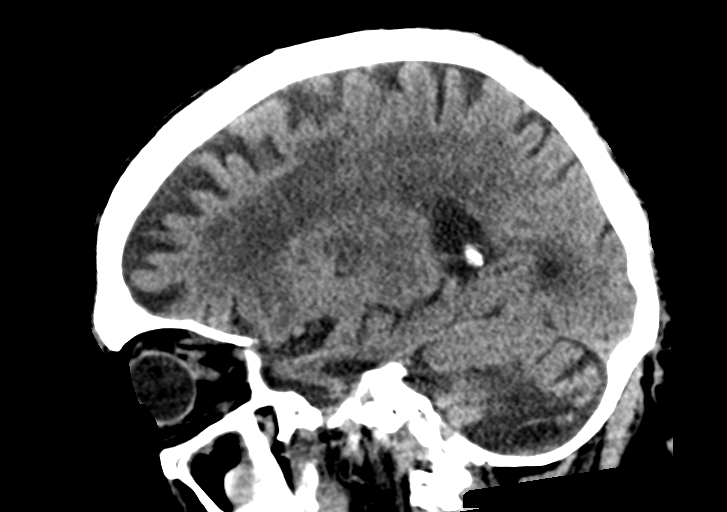

[15 of 47 positions shown; findings below may reference images not displayed]

FINDINGS: Brain: No intracranial hemorrhage or CT evidence of large acute
infarct.

Remote moderate size left cerebellar infarct with encephalomalacia.
Remote small right cerebellar infarct.

Remote bilateral basal ganglia infarcts.

Prominent chronic microvascular changes.

Global atrophy without hydrocephalus.

No intracranial mass lesion noted on this unenhanced exam.

Vascular: Vascular calcifications.

Skull: No skull fracture.

Sinuses/Orbits: No acute orbital abnormality. Paranasal sinus
mucosal thickening sparing the frontal sinuses.

Other: Negative
IMPRESSION: No intracranial hemorrhage or CT evidence of large acute infarct.

Remote moderate size left cerebellar infarct with encephalomalacia.
Remote small right cerebellar infarct. Remote bilateral basal
ganglia infarcts.

Prominent chronic microvascular changes.

Global atrophy.

Paranasal sinus mucosal thickening sparing the frontal sinuses.
